# Patient Record
Sex: Female | Born: 1995 | Race: White | Hispanic: No | Smoking: Former smoker
Health system: Southern US, Community
[De-identification: ages and names within clinical notes are randomized; demographics above are authoritative.]

## PROBLEM LIST (undated history)

## (undated) ENCOUNTER — Inpatient Hospital Stay: Payer: Self-pay

## (undated) DIAGNOSIS — F319 Bipolar disorder, unspecified: Secondary | ICD-10-CM

## (undated) DIAGNOSIS — K439 Ventral hernia without obstruction or gangrene: Secondary | ICD-10-CM

## (undated) DIAGNOSIS — G43109 Migraine with aura, not intractable, without status migrainosus: Secondary | ICD-10-CM

## (undated) DIAGNOSIS — F4323 Adjustment disorder with mixed anxiety and depressed mood: Secondary | ICD-10-CM

## (undated) DIAGNOSIS — Z87891 Personal history of nicotine dependence: Secondary | ICD-10-CM

## (undated) DIAGNOSIS — K219 Gastro-esophageal reflux disease without esophagitis: Secondary | ICD-10-CM

## (undated) DIAGNOSIS — Z87898 Personal history of other specified conditions: Secondary | ICD-10-CM

## (undated) HISTORY — PX: OTHER SURGICAL HISTORY: SHX169

## (undated) HISTORY — DX: Personal history of other specified conditions: Z87.898

## (undated) HISTORY — DX: Adjustment disorder with mixed anxiety and depressed mood: F43.23

## (undated) HISTORY — PX: NO PAST SURGERIES: SHX2092

## (undated) HISTORY — DX: Migraine with aura, not intractable, without status migrainosus: G43.109

## (undated) HISTORY — DX: Personal history of nicotine dependence: Z87.891

## (undated) HISTORY — DX: Ventral hernia without obstruction or gangrene: K43.9

---
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
## 2016-01-02 ENCOUNTER — Emergency Department
Admission: EM | Admit: 2016-01-02 | Discharge: 2016-01-02 | Disposition: A | Discharge status: Home / Self Care | Payer: Self-pay | Attending: Emergency Medicine | Admitting: Emergency Medicine

## 2016-01-02 ENCOUNTER — Encounter: Payer: Self-pay | Admitting: Emergency Medicine

## 2016-01-02 DIAGNOSIS — F17210 Nicotine dependence, cigarettes, uncomplicated: Secondary | ICD-10-CM | POA: Insufficient documentation

## 2016-01-02 DIAGNOSIS — N390 Urinary tract infection, site not specified: Secondary | ICD-10-CM | POA: Insufficient documentation

## 2016-01-02 LAB — WET PREP, GENITAL
Clue Cells Wet Prep HPF POC: NONE SEEN
SPERM: NONE SEEN
Trich, Wet Prep: NONE SEEN
Yeast Wet Prep HPF POC: NONE SEEN

## 2016-01-02 LAB — URINALYSIS COMPLETE WITH MICROSCOPIC (ARMC ONLY)
Bacteria, UA: NONE SEEN
Bilirubin Urine: NEGATIVE
GLUCOSE, UA: NEGATIVE mg/dL
NITRITE: NEGATIVE
PH: 6 (ref 5.0–8.0)
Protein, ur: NEGATIVE mg/dL
Specific Gravity, Urine: 1.019 (ref 1.005–1.030)

## 2016-01-02 LAB — COMPREHENSIVE METABOLIC PANEL
ALT: 11 U/L — ABNORMAL LOW (ref 14–54)
AST: 29 U/L (ref 15–41)
Albumin: 4.8 g/dL (ref 3.5–5.0)
Alkaline Phosphatase: 72 U/L (ref 38–126)
Anion gap: 9 (ref 5–15)
BILIRUBIN TOTAL: 1.1 mg/dL (ref 0.3–1.2)
BUN: 11 mg/dL (ref 6–20)
CHLORIDE: 105 mmol/L (ref 101–111)
CO2: 24 mmol/L (ref 22–32)
CREATININE: 0.79 mg/dL (ref 0.44–1.00)
Calcium: 9.6 mg/dL (ref 8.9–10.3)
Glucose, Bld: 77 mg/dL (ref 65–99)
Potassium: 3.9 mmol/L (ref 3.5–5.1)
Sodium: 138 mmol/L (ref 135–145)
TOTAL PROTEIN: 8.4 g/dL — AB (ref 6.5–8.1)

## 2016-01-02 LAB — POCT PREGNANCY, URINE: PREG TEST UR: NEGATIVE

## 2016-01-02 LAB — CHLAMYDIA/NGC RT PCR (ARMC ONLY)
Chlamydia Tr: DETECTED — AB
N gonorrhoeae: NOT DETECTED

## 2016-01-02 LAB — CBC
HCT: 40 % (ref 35.0–47.0)
Hemoglobin: 14.1 g/dL (ref 12.0–16.0)
MCH: 29.8 pg (ref 26.0–34.0)
MCHC: 35.2 g/dL (ref 32.0–36.0)
MCV: 84.6 fL (ref 80.0–100.0)
PLATELETS: 272 10*3/uL (ref 150–440)
RBC: 4.72 MIL/uL (ref 3.80–5.20)
RDW: 13.3 % (ref 11.5–14.5)
WBC: 10.5 10*3/uL (ref 3.6–11.0)

## 2016-01-02 LAB — LIPASE, BLOOD: LIPASE: 13 U/L (ref 11–51)

## 2016-01-02 LAB — HCG, QUANTITATIVE, PREGNANCY: HCG, BETA CHAIN, QUANT, S: 1 m[IU]/mL (ref <5)

## 2016-01-02 MED ORDER — NITROFURANTOIN MONOHYD MACRO 100 MG PO CAPS
100.0000 mg | ORAL_CAPSULE | Freq: Once | ORAL | Status: AC
  Administered 2016-01-02: 100 mg via ORAL
  Filled 2016-01-02: qty 1

## 2016-01-02 MED ORDER — NORETHINDRONE-ETH ESTRADIOL 1-5 MG-MCG PO TABS: 1.0000 | ORAL_TABLET | Freq: Every day | ORAL | 2 refills | Status: DC

## 2016-01-02 MED ORDER — SODIUM CHLORIDE 0.9 % IV BOLUS (SEPSIS)
1000.0000 mL | Freq: Once | INTRAVENOUS | Status: AC
  Administered 2016-01-02: 1000 mL via INTRAVENOUS

## 2016-01-02 MED ORDER — ONDANSETRON HCL 4 MG/2ML IJ SOLN
4.0000 mg | Freq: Once | INTRAMUSCULAR | Status: AC
  Administered 2016-01-02: 4 mg via INTRAVENOUS
  Filled 2016-01-02: qty 2

## 2016-01-02 MED ORDER — NITROFURANTOIN MONOHYD MACRO 100 MG PO CAPS: 100.0000 mg | ORAL_CAPSULE | Freq: Two times a day (BID) | ORAL | 0 refills | Status: AC

## 2016-01-02 MED ORDER — KETOROLAC TROMETHAMINE 30 MG/ML IJ SOLN
30.0000 mg | Freq: Once | INTRAMUSCULAR | Status: AC
  Administered 2016-01-02: 30 mg via INTRAVENOUS
  Filled 2016-01-02: qty 1

## 2016-01-02 NOTE — ED Provider Notes (Signed)
[facility] Emergency Department Provider Note  ____________________________________________  Time seen: Approximately 6:02 PM  I have reviewed the triage vital signs and the nursing notes.   HISTORY  Chief Complaint Abdominal Pain and Back Pain   HPI Martha [name] is a 20 y.o. female with no significant past medical history who presents for evaluation of abdominal cramping. She reports 5 days of intermittent abdominal cramping localized to the lower part of her abdomen and radiating to her bilateral lower back. She also reports 3 days of vaginal bleeding. She reports that a week ago she took a pregnancy test which was positive but took one yesterday and that one was negative. She has had one prior pregnancy that ended up in miscarriage. She is sexually active with one partner and uses no protection. She denies vaginal discharge or prior history of STDs. She reports that her pain is 5/10, associated with nausea. She denies vomiting, dysuria, hematuria, diarrhea, constipation.  History reviewed. No pertinent past medical history.  There are no active problems to display for this patient.   History reviewed. No pertinent surgical history.  Prior to Admission medications   Medication Sig Start Date End Date Taking? Authorizing Provider  nitrofurantoin, macrocrystal-monohydrate, (MACROBID) 100 MG capsule Take 1 capsule (100 mg total) by mouth 2 (two) times daily. 01/02/16 01/07/16  Nita Sickle, MD  norethindrone-ethinyl estradiol (FEMHRT 1/5) 1-5 MG-MCG TABS tablet Take 1 tablet by mouth daily. 01/02/16   Nita Sickle, MD    Allergies Review of patient's allergies indicates no known allergies.  History reviewed. No pertinent family history.  Social History Social History  Substance Use Topics  . Smoking status: Current Every Day Smoker    Packs/day: 0.50    Years: 4.00    Types: Cigarettes  . Smokeless tobacco: Never Used  . Alcohol use No      Review of Systems  Constitutional: Negative for fever. Eyes: Negative for visual changes. ENT: Negative for sore throat. Cardiovascular: Negative for chest pain. Respiratory: Negative for shortness of breath. Gastrointestinal: + lower abdominal cramping and nausea. No vomiting or diarrhea. Genitourinary: Negative for dysuria. + vaginal bleeding Musculoskeletal: Negative for back pain. Skin: Negative for rash. Neurological: Negative for headaches, weakness or numbness.  ____________________________________________   PHYSICAL EXAM:  VITAL SIGNS: ED Triage Vitals  Enc Vitals Group     BP 01/02/16 1737 118/79     Pulse Rate 01/02/16 1737 68     Resp 01/02/16 1737 18     Temp 01/02/16 1737 98.8 F (37.1 C)     Temp Source 01/02/16 1737 Oral     SpO2 01/02/16 1737 100 %     Weight 01/02/16 1737 138 lb (62.6 kg)     Height 01/02/16 1737 5\' 4"  (1.626 m)     Head Circumference --      Peak Flow --      Pain Score 01/02/16 1738 4     Pain Loc --      Pain Edu? --      Excl. in GC? --     Constitutional: Alert and oriented. Well appearing and in no apparent distress. HEENT:      Head: Normocephalic and atraumatic.         Eyes: Conjunctivae are normal. Sclera is non-icteric. EOMI. PERRL      Mouth/Throat: Mucous membranes are moist.       Neck: Supple with no signs of meningismus. Cardiovascular: Regular rate and rhythm. No murmurs, gallops, or  rubs. 2+ symmetrical distal pulses are present in all extremities. No JVD. Respiratory: Normal respiratory effort. Lungs are clear to auscultation bilaterally. No wheezes, crackles, or rhonchi.  Gastrointestinal: Soft, ttp over the suprapubic region, and non distended with positive bowel sounds. No rebound or guarding. Genitourinary: No CVA tenderness.  Pelvic exam: Normal external genitalia, no rashes or lesions. Normal cervical mucus. Os closed. No cervical motion tenderness.  No uterine or adnexal tenderness.   Musculoskeletal:  Nontender with normal range of motion in all extremities. No edema, cyanosis, or erythema of extremities. Neurologic: Normal speech and language. Face is symmetric. Moving all extremities. No gross focal neurologic deficits are appreciated. Skin: Skin is warm, dry and intact. No rash noted. Psychiatric: Mood and affect are normal. Speech and behavior are normal.  ____________________________________________   LABS (all labs ordered are listed, but only abnormal results are displayed)  Labs Reviewed  WET PREP, GENITAL - Abnormal; Notable for the following:       Result Value   WBC, Wet Prep HPF POC MODERATE (*)    All other components within normal limits  COMPREHENSIVE METABOLIC PANEL - Abnormal; Notable for the following:    Total Protein 8.4 (*)    ALT 11 (*)    All other components within normal limits  URINALYSIS COMPLETEWITH MICROSCOPIC ([facility] ONLY) - Abnormal; Notable for the following:    Color, Urine YELLOW (*)    APPearance CLOUDY (*)    Ketones, ur 2+ (*)    Hgb urine dipstick 1+ (*)    Leukocytes, UA 2+ (*)    Squamous Epithelial / LPF 6-30 (*)    All other components within normal limits  URINE CULTURE  CHLAMYDIA/NGC RT PCR ([facility] ONLY)  LIPASE, BLOOD  CBC  HCG, QUANTITATIVE, PREGNANCY  POC URINE PREG, ED  POCT PREGNANCY, URINE   ____________________________________________  EKG  none ____________________________________________  RADIOLOGY  none  ____________________________________________   PROCEDURES  Procedure(s) performed: None Procedures Critical Care performed:  None ____________________________________________   INITIAL IMPRESSION / ASSESSMENT AND PLAN / ED COURSE  20 y.o. female with no significant past medical history who presents for evaluation of intermittent moderate lower abdominal cramping x 5 days associated with vaginal bleeding. On exam she is well appearing, in no distress, her vital signs are within normal limits, her  abdomen is soft and with mild tenderness to palpation in the suprapubic region, no rales lower quadrant tenderness, no rebound or guarding, no flank tenderness. Impression diagnosis including menses vs miscarriage vs UTI vs STD. Plan for pelvic exam, labs, wet prep, Gc/chlamydia, UA, Upreg. Will treat symptoms with IV Zofran, IV Toradol, and IV fluids.  Clinical Course  Comment By Time  Pelvic exam within normal limits with no CMT, normal cervical discharge, no adnexal tenderness. Serial abdominal exams and no tenderness. Nita Sickle, MD 07/30 1926  UA positive for UTI. Patient was started on Macrobid. Wet prep is negative. GC and chlamydia pending however with normal pelvic exam will hold off on treatment at this time. Serial abdominal exams reassuring with mild suprapubic tenderness but no right lower quadrant tenderness, no rebound or guarding. I spent 15 minutes talking to the patient about safe sex and counseling how to avoid pregnancy and STDs. We'll provide her with a prescription for birth control. Nita Sickle, MD 07/30 2004    Pertinent labs & imaging results that were available during my care of the patient were reviewed by me and considered in my medical decision making (see chart for details).  ____________________________________________   FINAL CLINICAL IMPRESSION(S) / ED DIAGNOSES  Final diagnoses:  UTI (lower urinary tract infection)      NEW MEDICATIONS STARTED DURING THIS VISIT:  New Prescriptions   NITROFURANTOIN, MACROCRYSTAL-MONOHYDRATE, (MACROBID) 100 MG CAPSULE    Take 1 capsule (100 mg total) by mouth 2 (two) times daily.   NORETHINDRONE-ETHINYL ESTRADIOL (FEMHRT 1/5) 1-5 MG-MCG TABS TABLET    Take 1 tablet by mouth daily.     Note:  This document was prepared using Dragon voice recognition software and may include unintentional dictation errors.    Nita Sickle, MD 01/02/16 2008

## 2016-01-02 NOTE — ED Triage Notes (Signed)
Pt presents to ED with c/o lower abdominal pain and lower back pain at this time. Pt states today she started having nausea and vomiting. NAD noted at this time.

## 2016-01-02 NOTE — Discharge Instructions (Signed)

## 2016-01-02 NOTE — ED Notes (Signed)
Patient resting, watching television, family members at bedside. Patient denies need for anything at this time. Pelvic exam completed by EDP in the presence of NT. Tolerated well.

## 2016-01-02 NOTE — ED Notes (Signed)
Pt states she has had some positive pregnancy tests and some negative.  C/o intermittent cramping x1 week. Pt reports she is on her period at this time.

## 2016-01-02 NOTE — ED Notes (Signed)
Attempted IV access x1 unsuccessful able to obtain blood specimens

## 2016-01-03 ENCOUNTER — Telehealth: Payer: Self-pay | Admitting: Emergency Medicine

## 2016-01-03 NOTE — Telephone Encounter (Signed)
Called patient to inform of std results.  Positive chlamydia and was not treated.  Per Dr.Quale--may call in azithromycin 1 gram po to patient preferred pharmacy when we contact her.  She did not answer phone and no voicemail set up.  Will send letter.

## 2016-01-05 LAB — URINE CULTURE

## 2016-02-03 ENCOUNTER — Encounter: Payer: Self-pay | Admitting: Medical Oncology

## 2016-02-03 ENCOUNTER — Emergency Department
Admission: EM | Admit: 2016-02-03 | Discharge: 2016-02-03 | Disposition: A | Discharge status: Home / Self Care | Payer: Self-pay | Attending: Emergency Medicine | Admitting: Emergency Medicine

## 2016-02-03 DIAGNOSIS — F17210 Nicotine dependence, cigarettes, uncomplicated: Secondary | ICD-10-CM | POA: Insufficient documentation

## 2016-02-03 DIAGNOSIS — Z79899 Other long term (current) drug therapy: Secondary | ICD-10-CM | POA: Insufficient documentation

## 2016-02-03 DIAGNOSIS — Z792 Long term (current) use of antibiotics: Secondary | ICD-10-CM | POA: Insufficient documentation

## 2016-02-03 DIAGNOSIS — H6091 Unspecified otitis externa, right ear: Secondary | ICD-10-CM | POA: Insufficient documentation

## 2016-02-03 DIAGNOSIS — H6691 Otitis media, unspecified, right ear: Secondary | ICD-10-CM | POA: Insufficient documentation

## 2016-02-03 MED ORDER — NEOMYCIN-POLYMYXIN-HC 3.5-10000-1 OP SUSP: 3.0000 [drp] | Freq: Four times a day (QID) | OPHTHALMIC | 0 refills | Status: AC

## 2016-02-03 MED ORDER — AMOXICILLIN 500 MG PO TABS: 500.0000 mg | ORAL_TABLET | Freq: Three times a day (TID) | ORAL | 0 refills | Status: AC

## 2016-02-03 NOTE — ED Notes (Signed)
Pt in via triage with complaints of right ear pain and "green puss like" drainage x "2-3 days."  Pt denies any other symptoms.  Pt A/Ox4, no immediate distress noted.

## 2016-02-03 NOTE — ED Provider Notes (Signed)
[facility] Emergency Department Provider Note  ____________________________________________  Time seen: Approximately 1:20 PM  I have reviewed the triage vital signs and the nursing notes.   HISTORY  Chief Complaint Otalgia    HPI Martha [name] is a 20 y.o. female , NAD, presents to emergency department with2 day history of right ear pain and drainage. Patient states that approximately one week ago she got water in the right ear while in the shower. States she was unable to get the water out. Has been placing cotton balls about the outside of the ear canal with no resolution of symptoms. Over the last 2 days is noted yellow drainage and increasing pain about the right ear canal and in her ear. Has noted no bloody discharge. Denies swimming or other emersion in water. Denies any injury or trauma to the ear or face. Has not had any fevers, chills, body aches. Denies neck pain. Has not noted any skin sores. Denies headaches, nasal congestion, runny nose, sinus pressure, sore throat. Has had no chest pain, shortness breath, abdominal pain, nausea, vomiting.   History reviewed. No pertinent past medical history.  There are no active problems to display for this patient.   History reviewed. No pertinent surgical history.  Prior to Admission medications   Medication Sig Start Date End Date Taking? Authorizing Provider  amoxicillin (AMOXIL) 500 MG tablet Take 1 tablet (500 mg total) by mouth 3 (three) times daily with meals. 02/03/16 02/10/16  Avontae Burkhead L Ashtynn Berke, PA-C  neomycin-polymyxin-hydrocortisone (CORTISPORIN) 3.5-10000-1 ophthalmic suspension Place 3 drops into the right eye 4 (four) times daily. Please use these drops in the RIGHT EAR. 02/03/16 02/10/16  Jaelie Aguilera L Finnley Lewis, PA-C  norethindrone-ethinyl estradiol (FEMHRT 1/5) 1-5 MG-MCG TABS tablet Take 1 tablet by mouth daily. 01/02/16   Nita Sicklearolina Veronese, MD    Allergies Review of patient's allergies indicates no known  allergies.  No family history on file.  Social History Social History  Substance Use Topics  . Smoking status: Current Every Day Smoker    Packs/day: 0.50    Years: 4.00    Types: Cigarettes  . Smokeless tobacco: Never Used  . Alcohol use No     Review of Systems  Constitutional: No fever/chills ENT: Positive right ear pain, right ear drainage, muffled hearing. No tinnitus, sore throat, nasal congestion, runny nose, sinus pressure.  Cardiovascular: No chest pain. Respiratory: No shortness of breath.  Gastrointestinal: No abdominal pain.  No nausea, vomiting.   Musculoskeletal: Negative for neck pain or general myalgias.  Skin: Negative for rash, redness, swelling, skin sores. Neurological: Negative for headaches. 10-point ROS otherwise negative.  ____________________________________________   PHYSICAL EXAM:  VITAL SIGNS: ED Triage Vitals  Enc Vitals Group     BP 02/03/16 1313 119/77     Pulse Rate 02/03/16 1313 81     Resp 02/03/16 1313 16     Temp 02/03/16 1313 98 F (36.7 C)     Temp src --      SpO2 02/03/16 1313 100 %     Weight 02/03/16 1314 135 lb (61.2 kg)     Height 02/03/16 1314 5\' 4"  (1.626 m)     Head Circumference --      Peak Flow --      Pain Score 02/03/16 1314 6     Pain Loc --      Pain Edu? --      Excl. in GC? --      Constitutional: Alert and oriented. Well  appearing and in no acute distress. Eyes: Conjunctivae are normal without icterus or injection Head: Atraumatic. ENT:      Ears: Right external ear canal with moderate swelling and yellow discharge. Tenderness with manipulation of the right tragus. No tenderness with manipulation of the right pinna. Right TM visualized with moderate erythema, trace effusion but no bulging or perforation.       Nose: No congestion/rhinnorhea. Neck: Supple with full range of motion. Hematological/Lymphatic/Immunilogical: Positive right anterior cervical lymphadenopathy with trace tenderness to palpation  but is mobile. Cardiovascular: Normal rate, regular rhythm. Normal S1 and S2.  Good peripheral circulation. Respiratory: Normal respiratory effort without tachypnea or retractions. Lungs CTAB with breath sounds noted in all lung fields. No wheeze, rhonchi, rales. Neurologic:  Normal speech and language. No gross focal neurologic deficits are appreciated.  Skin:  Skin is warm, dry and intact. No rash noted. Psychiatric: Mood and affect are normal. Speech and behavior are normal. Patient exhibits appropriate insight and judgement.   ____________________________________________   LABS  None ____________________________________________  EKG  None ____________________________________________  RADIOLOGY  None ____________________________________________    PROCEDURES  Procedure(s) performed: None   Procedures   Medications - No data to display   ____________________________________________   INITIAL IMPRESSION / ASSESSMENT AND PLAN / ED COURSE  Pertinent labs & imaging results that were available during my care of the patient were reviewed by me and considered in my medical decision making (see chart for details).  Clinical Course    Patient's diagnosis is consistent with Right otitis externa and otitis media. Patient will be discharged home with prescriptions for amoxicillin and Cortisporin drops to use as directed. I prescribed Cortisporin Ophthalmic drops to be used in the ears as the otic solution is significantly more expensive. The patient will be able to afford the $4 bottle of the Cortisporin Ophthalmic drops which is the same formulation as the otic drops. May take over-the-counter Tylenol or ibuprofen as needed for pain. Continue to protect the right ear from water while in the shower or bath. The 420 vitals, hot tubs or any other activity which the ears would be submerged. Patient is to follow up with [facility] if symptoms persist past this  treatment course. Patient is given ED precautions to return to the ED for any worsening or new symptoms.    ____________________________________________  FINAL CLINICAL IMPRESSION(S) / ED DIAGNOSES  Final diagnoses:  Otitis externa, right  Acute right otitis media, recurrence not specified, unspecified otitis media type      NEW MEDICATIONS STARTED DURING THIS VISIT:  Discharge Medication List as of 02/03/2016  1:32 PM    START taking these medications   Details  amoxicillin (AMOXIL) 500 MG tablet Take 1 tablet (500 mg total) by mouth 3 (three) times daily with meals., Starting Thu 02/03/2016, Until Thu 02/10/2016, Print    neomycin-polymyxin-hydrocortisone (CORTISPORIN) 3.5-10000-1 ophthalmic suspension Place 3 drops into the right eye 4 (four) times daily. Please use these drops in the RIGHT EAR., Starting Thu 02/03/2016, Until Thu 02/10/2016, Print             Ernestene Kiel [place], PA-C 02/03/16 1405    Emily Filbert, MD 02/03/16 [phone]

## 2016-02-03 NOTE — ED Triage Notes (Signed)
Pt reports rt ear pain x 2 days without fever

## 2016-05-16 ENCOUNTER — Encounter: Payer: Self-pay | Admitting: Emergency Medicine

## 2016-05-16 DIAGNOSIS — F17210 Nicotine dependence, cigarettes, uncomplicated: Secondary | ICD-10-CM | POA: Insufficient documentation

## 2016-05-16 DIAGNOSIS — N939 Abnormal uterine and vaginal bleeding, unspecified: Secondary | ICD-10-CM | POA: Insufficient documentation

## 2016-05-16 DIAGNOSIS — Z5321 Procedure and treatment not carried out due to patient leaving prior to being seen by health care provider: Secondary | ICD-10-CM | POA: Insufficient documentation

## 2016-05-16 LAB — BASIC METABOLIC PANEL
Anion gap: 5 (ref 5–15)
BUN: 9 mg/dL (ref 6–20)
CHLORIDE: 103 mmol/L (ref 101–111)
CO2: 30 mmol/L (ref 22–32)
Calcium: 9.5 mg/dL (ref 8.9–10.3)
Creatinine, Ser: 0.57 mg/dL (ref 0.44–1.00)
GFR calc Af Amer: >60 mL/min (ref >60)
GFR calc non Af Amer: >60 mL/min (ref >60)
GLUCOSE: 82 mg/dL (ref 65–99)
Potassium: 3.6 mmol/L (ref 3.5–5.1)
Sodium: 138 mmol/L (ref 135–145)

## 2016-05-16 LAB — POCT PREGNANCY, URINE: Preg Test, Ur: NEGATIVE

## 2016-05-16 NOTE — ED Triage Notes (Signed)
Pt ambulatory to triage with steady gait, no distress noted. Pt reports she started to have vaginal bleeding today, assumed it was her period starting, Pt sts "I went to bathroom and when I wiped, there was blood in my urine and not my vagina."

## 2016-05-16 NOTE — ED Notes (Signed)
Pt found sitting in lobby, st did not hear name called; triage nurse notified

## 2016-05-16 NOTE — ED Notes (Signed)
No answer when called from lobby 

## 2016-05-17 ENCOUNTER — Emergency Department
Admission: EM | Admit: 2016-05-17 | Discharge: 2016-05-17 | Discharge status: Against Medical Advice | Payer: Self-pay | Attending: Emergency Medicine | Admitting: Emergency Medicine

## 2016-05-17 LAB — CBC
HCT: 41.8 % (ref 35.0–47.0)
Hemoglobin: 14 g/dL (ref 12.0–16.0)
MCH: 28.5 pg (ref 26.0–34.0)
MCHC: 33.5 g/dL (ref 32.0–36.0)
MCV: 85.1 fL (ref 80.0–100.0)
Platelets: UNDETERMINED 10*3/uL (ref 150–440)
RBC: 4.91 MIL/uL (ref 3.80–5.20)
RDW: 13.3 % (ref 11.5–14.5)
WBC: 12.5 10*3/uL — ABNORMAL HIGH (ref 3.6–11.0)

## 2016-05-17 LAB — URINALYSIS, COMPLETE (UACMP) WITH MICROSCOPIC
BILIRUBIN URINE: NEGATIVE
GLUCOSE, UA: NEGATIVE mg/dL
Ketones, ur: NEGATIVE mg/dL
LEUKOCYTES UA: NEGATIVE
Nitrite: NEGATIVE
PH: 7 (ref 5.0–8.0)
Protein, ur: NEGATIVE mg/dL
Specific Gravity, Urine: 1.013 (ref 1.005–1.030)

## 2016-05-18 ENCOUNTER — Encounter: Payer: Self-pay | Admitting: Urgent Care

## 2016-05-18 DIAGNOSIS — R102 Pelvic and perineal pain: Secondary | ICD-10-CM | POA: Insufficient documentation

## 2016-05-18 DIAGNOSIS — F17210 Nicotine dependence, cigarettes, uncomplicated: Secondary | ICD-10-CM | POA: Insufficient documentation

## 2016-05-18 LAB — COMPREHENSIVE METABOLIC PANEL
ALK PHOS: 71 U/L (ref 38–126)
ALT: 17 U/L (ref 14–54)
ANION GAP: 8 (ref 5–15)
AST: 24 U/L (ref 15–41)
Albumin: 4.7 g/dL (ref 3.5–5.0)
BILIRUBIN TOTAL: 0.2 mg/dL — AB (ref 0.3–1.2)
BUN: 9 mg/dL (ref 6–20)
CALCIUM: 9.3 mg/dL (ref 8.9–10.3)
CO2: 24 mmol/L (ref 22–32)
Chloride: 105 mmol/L (ref 101–111)
Creatinine, Ser: 0.57 mg/dL (ref 0.44–1.00)
GFR calc non Af Amer: >60 mL/min (ref >60)
Glucose, Bld: 91 mg/dL (ref 65–99)
POTASSIUM: 3.6 mmol/L (ref 3.5–5.1)
SODIUM: 137 mmol/L (ref 135–145)
TOTAL PROTEIN: 8.7 g/dL — AB (ref 6.5–8.1)

## 2016-05-18 LAB — CBC
HCT: 41.5 % (ref 35.0–47.0)
HEMOGLOBIN: 14.4 g/dL (ref 12.0–16.0)
MCH: 29.2 pg (ref 26.0–34.0)
MCHC: 34.8 g/dL (ref 32.0–36.0)
MCV: 83.7 fL (ref 80.0–100.0)
Platelets: 271 10*3/uL (ref 150–440)
RBC: 4.95 MIL/uL (ref 3.80–5.20)
RDW: 12.8 % (ref 11.5–14.5)
WBC: 8.3 10*3/uL (ref 3.6–11.0)

## 2016-05-18 LAB — LIPASE, BLOOD: Lipase: 13 U/L (ref 11–51)

## 2016-05-18 NOTE — ED Triage Notes (Addendum)
Patient presents with c/o abdominal cramping that began earlier today. Patient reports that she has been shaking and "feverish". (+) N/V x 1 episode. Denies urinary symptoms. Patient admits to drinking ETOH tonight.

## 2016-05-19 ENCOUNTER — Emergency Department: Payer: Self-pay

## 2016-05-19 ENCOUNTER — Emergency Department
Admission: EM | Admit: 2016-05-19 | Discharge: 2016-05-19 | Disposition: A | Discharge status: Home / Self Care | Payer: Self-pay | Attending: Emergency Medicine | Admitting: Emergency Medicine

## 2016-05-19 DIAGNOSIS — R102 Pelvic and perineal pain: Secondary | ICD-10-CM

## 2016-05-19 LAB — URINALYSIS, COMPLETE (UACMP) WITH MICROSCOPIC
BILIRUBIN URINE: NEGATIVE
Bacteria, UA: NONE SEEN
GLUCOSE, UA: NEGATIVE mg/dL
KETONES UR: NEGATIVE mg/dL
LEUKOCYTES UA: NEGATIVE
NITRITE: NEGATIVE
PH: 6 (ref 5.0–8.0)
Protein, ur: NEGATIVE mg/dL
SPECIFIC GRAVITY, URINE: 1.005 (ref 1.005–1.030)

## 2016-05-19 LAB — ETHANOL: Alcohol, Ethyl (B): 27 mg/dL — ABNORMAL HIGH (ref <5)

## 2016-05-19 LAB — PREGNANCY, URINE: Preg Test, Ur: NEGATIVE

## 2016-05-19 MED ORDER — OXYCODONE-ACETAMINOPHEN 5-325 MG PO TABS: 1.0000 | ORAL_TABLET | ORAL | 0 refills | Status: DC | PRN

## 2016-05-19 MED ORDER — ONDANSETRON 4 MG PO TBDP: 4.0000 mg | ORAL_TABLET | Freq: Three times a day (TID) | ORAL | 0 refills | Status: DC | PRN

## 2016-05-19 MED ORDER — KETOROLAC TROMETHAMINE 30 MG/ML IJ SOLN
30.0000 mg | Freq: Once | INTRAMUSCULAR | Status: AC
  Administered 2016-05-19: 30 mg via INTRAVENOUS
  Filled 2016-05-19: qty 1

## 2016-05-19 NOTE — ED Notes (Signed)
Upon assessment pt reports pain around the time of her period that escalates when on her period. Pt denies any discomfort upon urination.

## 2016-05-19 NOTE — ED Provider Notes (Signed)
[facility] Emergency Department Provider Note   First MD Initiated Contact with Patient 05/19/16 0106     (approximate)  I have reviewed the triage vital signs and the nursing notes.   HISTORY  Chief Complaint Abdominal Cramping    HPI Martha [name] is a 20 y.o. female presents with pelvic pain described as cramping currently 4 out of 10 which the patient states occurs monthly during her menses. Patient's admits to nausea however denies any vomiting no diarrhea or constipation. Patient denies any urinary symptoms. Patient denies any vaginal discharge. The patient's chart revealed that she's had a visit to the emergency department in July of this year with similar symptoms with a negative evaluation performed.   Past medical history Chronic pelvic pain There are no active problems to display for this patient.   Past surgical history None  Prior to Admission medications   Medication Sig Start Date End Date Taking? Authorizing Provider  norethindrone-ethinyl estradiol (FEMHRT 1/5) 1-5 MG-MCG TABS tablet Take 1 tablet by mouth daily. 01/02/16   Nita Sickle, MD    Allergies Patient has no known allergies.  No family history on file.  Social History Social History  Substance Use Topics  . Smoking status: Current Every Day Smoker    Packs/day: 0.50    Years: 4.00    Types: Cigarettes  . Smokeless tobacco: Never Used  . Alcohol use Yes    Review of Systems Constitutional: No fever/chills Eyes: No visual changes. ENT: No sore throat. Cardiovascular: Denies chest pain. Respiratory: Denies shortness of breath. Gastrointestinal: No abdominal pain.  No nausea, no vomiting.  No diarrhea.  No constipation. Genitourinary: Negative for dysuria.Positive pelvic pain Musculoskeletal: Negative for back pain. Skin: Negative for rash. Neurological: Negative for headaches, focal weakness or numbness.  10-point ROS otherwise  negative.  ____________________________________________   PHYSICAL EXAM:  VITAL SIGNS: ED Triage Vitals [05/18/16 2320]  Enc Vitals Group     BP 127/85     Pulse Rate 98     Resp 16     Temp 97.7 F (36.5 C)     Temp Source Oral     SpO2 98 %     Weight 135 lb (61.2 kg)     Height 5\' 3"  (1.6 m)     Head Circumference      Peak Flow      Pain Score 10     Pain Loc      Pain Edu?      Excl. in GC?     Constitutional: Alert and oriented. Well appearing and in no acute distress. Eyes: Conjunctivae are normal. PERRL. EOMI. Head: Atraumatic. Mouth/Throat: Mucous membranes are moist.  Oropharynx non-erythematous. Neck: No stridor. Cardiovascular: Normal rate, regular rhythm. Good peripheral circulation. Grossly normal heart sounds. Respiratory: Normal respiratory effort.  No retractions. Lungs CTAB. Gastrointestinal: Soft and nontender. No distention.  Genitourinary: Unremarkable external genitalia, cervix appeared normal with no discharge noted. No cervical motion tenderness  Musculoskeletal: No lower extremity tenderness nor edema. No gross deformities of extremities. Neurologic:  Normal speech and language. No gross focal neurologic deficits are appreciated.  Skin:  Skin is warm, dry and intact. No rash noted. Psychiatric: Mood and affect are normal. Speech and behavior are normal.  ____________________________________________   LABS (all labs ordered are listed, but only abnormal results are displayed)  Labs Reviewed  COMPREHENSIVE METABOLIC PANEL - Abnormal; Notable for the following:       Result Value   Total  Protein 8.7 (*)    Total Bilirubin 0.2 (*)    All other components within normal limits  URINALYSIS, COMPLETE (UACMP) WITH MICROSCOPIC - Abnormal; Notable for the following:    Color, Urine STRAW (*)    APPearance CLEAR (*)    Hgb urine dipstick SMALL (*)    Squamous Epithelial / LPF 0-5 (*)    All other components within normal limits  ETHANOL -  Abnormal; Notable for the following:    Alcohol, Ethyl (B) 27 (*)    All other components within normal limits  LIPASE, BLOOD  CBC  PREGNANCY, URINE  POC URINE PREG, ED     RADIOLOGY I, [place] N BROWN, personally viewed and evaluated these images (plain radiographs) as part of my medical decision making, as well as reviewing the written report by the radiologist.  [place] Transvaginal Non-ob  Result Date: 05/19/2016 CLINICAL DATA:  20 y/o  F; diffuse pelvic pain for 1 week EXAM: TRANSABDOMINAL AND TRANSVAGINAL ULTRASOUND OF PELVIS TECHNIQUE: Both transabdominal and transvaginal ultrasound examinations of the pelvis were performed. Transabdominal technique was performed for global imaging of the pelvis including uterus, ovaries, adnexal regions, and pelvic cul-de-sac. It was necessary to proceed with endovaginal exam following the transabdominal exam to visualize the endometrium and ovaries. COMPARISON:  None FINDINGS: Uterus Measurements: 6.2 x 2.9 x 5.1 cm. No fibroids or other mass visualized. Endometrium Thickness: 4 mm.  No focal abnormality visualized. Right ovary Measurements: 2.6 x 2.9 x 2.9 cm. Normal appearance/no adnexal mass. Left ovary Measurements: 3.5 x 2.3 x 2.7 cm. Normal appearance/no adnexal mass. Other findings Moderate volume of simple fluid in the pelvis. IMPRESSION: No acute process of the uterus or ovaries. No adnexal mass. Moderate volume of simple fluid in the pelvis, borderline for what would be expected for physiologic fluid. Electronically Signed   By: Mitzi HansenLance  Furusawa-Stratton M.D.   On: 05/19/2016 02:59   [place] Pelvis Complete  Result Date: 05/19/2016 CLINICAL DATA:  20 y/o  F; diffuse pelvic pain for 1 week EXAM: TRANSABDOMINAL AND TRANSVAGINAL ULTRASOUND OF PELVIS TECHNIQUE: Both transabdominal and transvaginal ultrasound examinations of the pelvis were performed. Transabdominal technique was performed for global imaging of the pelvis including uterus, ovaries, adnexal  regions, and pelvic cul-de-sac. It was necessary to proceed with endovaginal exam following the transabdominal exam to visualize the endometrium and ovaries. COMPARISON:  None FINDINGS: Uterus Measurements: 6.2 x 2.9 x 5.1 cm. No fibroids or other mass visualized. Endometrium Thickness: 4 mm.  No focal abnormality visualized. Right ovary Measurements: 2.6 x 2.9 x 2.9 cm. Normal appearance/no adnexal mass. Left ovary Measurements: 3.5 x 2.3 x 2.7 cm. Normal appearance/no adnexal mass. Other findings Moderate volume of simple fluid in the pelvis. IMPRESSION: No acute process of the uterus or ovaries. No adnexal mass. Moderate volume of simple fluid in the pelvis, borderline for what would be expected for physiologic fluid. Electronically Signed   By: Mitzi HansenLance  Furusawa-Stratton M.D.   On: 05/19/2016 02:59     Procedures     INITIAL IMPRESSION / ASSESSMENT AND PLAN / ED COURSE  Pertinent labs & imaging results that were available during my care of the patient were reviewed by me and considered in my medical decision making (see chart for details).  Of note the patient's mother admits to a history of endometriosis. No clear etiology for the patient's pelvic pain obtained in the emergency department. Laboratory data ultrasound unremarkable. Patient has no pain at this time. We'll refer the patient to gynecology for  further outpatient evaluation and management.   Clinical Course     ____________________________________________  FINAL CLINICAL IMPRESSION(S) / ED DIAGNOSES  Final diagnoses:  Pelvic pain     MEDICATIONS GIVEN DURING THIS VISIT:  Medications  ketorolac (TORADOL) 30 MG/ML injection 30 mg (30 mg Intravenous Given 05/19/16 0151)     NEW OUTPATIENT MEDICATIONS STARTED DURING THIS VISIT:  New Prescriptions   No medications on file    Modified Medications   No medications on file    Discontinued Medications   No medications on file     Note:  This document was  prepared using Dragon voice recognition software and may include unintentional dictation errors.    Darci Currentandolph N Brown, MD 05/19/16 [phone]

## 2016-05-19 NOTE — ED Notes (Signed)
MD at bedside. 

## 2016-05-19 NOTE — ED Notes (Signed)
Pt ambulated to toilet in room without difficulty.

## 2017-06-05 NOTE — L&D Delivery Note (Signed)
Delivery Note At 4:56 AM a viable female was delivered via Vaginal, Spontaneous (Presentation: OA).  APGAR: 8, 9; weight  pending.   Placenta status:spontaneous, intact.  Cord: 3VC without complications.  Cord pH: N/A  Anesthesia: Epidural   Episiotomy:  none Lacerations:  none Suture Repair: N/A Est. Blood Loss (mL): 300mL   Mom to postpartum.  Baby to Couplet care / Skin to Skin.  Vena Austriandreas Moana Munford 05/19/2018, 5:08 AM

## 2017-07-31 ENCOUNTER — Encounter: Payer: Self-pay | Admitting: Emergency Medicine

## 2017-07-31 ENCOUNTER — Emergency Department: Payer: Self-pay

## 2017-07-31 ENCOUNTER — Emergency Department
Admission: EM | Admit: 2017-07-31 | Discharge: 2017-07-31 | Disposition: A | Discharge status: Home / Self Care | Payer: Self-pay | Attending: Emergency Medicine | Admitting: Emergency Medicine

## 2017-07-31 ENCOUNTER — Other Ambulatory Visit: Payer: Self-pay

## 2017-07-31 DIAGNOSIS — Z87891 Personal history of nicotine dependence: Secondary | ICD-10-CM | POA: Insufficient documentation

## 2017-07-31 DIAGNOSIS — Z79899 Other long term (current) drug therapy: Secondary | ICD-10-CM | POA: Insufficient documentation

## 2017-07-31 DIAGNOSIS — R109 Unspecified abdominal pain: Secondary | ICD-10-CM

## 2017-07-31 DIAGNOSIS — N760 Acute vaginitis: Secondary | ICD-10-CM

## 2017-07-31 DIAGNOSIS — O23591 Infection of other part of genital tract in pregnancy, first trimester: Secondary | ICD-10-CM | POA: Insufficient documentation

## 2017-07-31 DIAGNOSIS — Z3201 Encounter for pregnancy test, result positive: Secondary | ICD-10-CM | POA: Insufficient documentation

## 2017-07-31 DIAGNOSIS — Z3A01 Less than 8 weeks gestation of pregnancy: Secondary | ICD-10-CM | POA: Insufficient documentation

## 2017-07-31 DIAGNOSIS — B9689 Other specified bacterial agents as the cause of diseases classified elsewhere: Secondary | ICD-10-CM | POA: Insufficient documentation

## 2017-07-31 LAB — URINALYSIS, COMPLETE (UACMP) WITH MICROSCOPIC
Bacteria, UA: NONE SEEN
Bilirubin Urine: NEGATIVE
GLUCOSE, UA: NEGATIVE mg/dL
Ketones, ur: NEGATIVE mg/dL
Leukocytes, UA: NEGATIVE
NITRITE: NEGATIVE
PROTEIN: NEGATIVE mg/dL
SPECIFIC GRAVITY, URINE: 1.03 (ref 1.005–1.030)
pH: 5 (ref 5.0–8.0)

## 2017-07-31 LAB — WET PREP, GENITAL
SPERM: NONE SEEN
Trich, Wet Prep: NONE SEEN
YEAST WET PREP: NONE SEEN

## 2017-07-31 LAB — BASIC METABOLIC PANEL
Anion gap: 9 (ref 5–15)
BUN: 14 mg/dL (ref 6–20)
CALCIUM: 9.5 mg/dL (ref 8.9–10.3)
CO2: 25 mmol/L (ref 22–32)
CREATININE: 0.63 mg/dL (ref 0.44–1.00)
Chloride: 103 mmol/L (ref 101–111)
Glucose, Bld: 90 mg/dL (ref 65–99)
Potassium: 3.9 mmol/L (ref 3.5–5.1)
SODIUM: 137 mmol/L (ref 135–145)

## 2017-07-31 LAB — CBC
HCT: 39.3 % (ref 35.0–47.0)
Hemoglobin: 13.4 g/dL (ref 12.0–16.0)
MCH: 29.2 pg (ref 26.0–34.0)
MCHC: 34 g/dL (ref 32.0–36.0)
MCV: 85.9 fL (ref 80.0–100.0)
PLATELETS: 290 10*3/uL (ref 150–440)
RBC: 4.58 MIL/uL (ref 3.80–5.20)
RDW: 12.6 % (ref 11.5–14.5)
WBC: 9.8 10*3/uL (ref 3.6–11.0)

## 2017-07-31 LAB — CHLAMYDIA/NGC RT PCR (ARMC ONLY)
Chlamydia Tr: NOT DETECTED
N gonorrhoeae: NOT DETECTED

## 2017-07-31 LAB — ABO/RH: ABO/RH(D): O POS

## 2017-07-31 LAB — POCT PREGNANCY, URINE: Preg Test, Ur: POSITIVE — AB

## 2017-07-31 LAB — HCG, QUANTITATIVE, PREGNANCY: HCG, BETA CHAIN, QUANT, S: 340 m[IU]/mL — AB (ref <5)

## 2017-07-31 MED ORDER — METRONIDAZOLE 500 MG PO TABS: 500.0000 mg | ORAL_TABLET | Freq: Two times a day (BID) | ORAL | 0 refills | Status: AC

## 2017-07-31 MED ORDER — METRONIDAZOLE 500 MG PO TABS
ORAL_TABLET | ORAL | Status: AC
  Filled 2017-07-31: qty 1

## 2017-07-31 MED ORDER — METRONIDAZOLE 500 MG PO TABS
500.0000 mg | ORAL_TABLET | Freq: Once | ORAL | Status: AC
  Administered 2017-07-31: 500 mg via ORAL

## 2017-07-31 NOTE — ED Triage Notes (Signed)
Says [redacted] week pregnant with abd cramping for 2 days.  Yesterday started having whitish discharge and now it looks like blood.

## 2017-07-31 NOTE — ED Provider Notes (Signed)
[facility] Emergency Department Provider Note ____________________________________________   First MD Initiated Contact with Patient 07/31/17 1714     (approximate)  I have reviewed the triage vital signs and the nursing notes.   HISTORY  Chief Complaint Vaginal Discharge; Vaginal Bleeding; and Abdominal Cramping    HPI Martha [name] is a 22 y.o. female with no significant past medical history, who is G1P0 at unclear gestational age but likely around 5 weeks.  She presents with vaginal discharge for the last 2 days, initially clear/whitish, and now more pink with some blood.  She reports associated abdominal cramping, but no constant abdominal pain.  No weakness or lightheadedness, fever chills, urinary symptoms, or back pain.   History reviewed. No pertinent past medical history.  There are no active problems to display for this patient.   History reviewed. No pertinent surgical history.  Prior to Admission medications   Medication Sig Start Date End Date Taking? Authorizing Provider  metroNIDAZOLE (FLAGYL) 500 MG tablet Take 1 tablet (500 mg total) by mouth 2 (two) times daily for 7 days. 08/01/17 08/08/17  Dionne BucySiadecki, Jeanann Balinski, MD  norethindrone-ethinyl estradiol (FEMHRT 1/5) 1-5 MG-MCG TABS tablet Take 1 tablet by mouth daily. 01/02/16   Nita SickleVeronese, [place], MD  ondansetron (ZOFRAN ODT) 4 MG disintegrating tablet Take 1 tablet (4 mg total) by mouth every 8 (eight) hours as needed for nausea or vomiting. 05/19/16   Darci CurrentBrown, Eleva N, MD  oxyCODONE-acetaminophen (ROXICET) 5-325 MG tablet Take 1 tablet by mouth every 4 (four) hours as needed for severe pain. 05/19/16   Darci CurrentBrown, [place] N, MD    Allergies Patient has no known allergies.  No family history on file.  Social History Social History   Tobacco Use  . Smoking status: Former Smoker    Packs/day: 0.50    Years: 4.00    Pack years: 2.00    Types: Cigarettes  . Smokeless tobacco: Never  Used  Substance Use Topics  . Alcohol use: Yes  . Drug use: No    Review of Systems  Constitutional: No fever/chills. Eyes: No redness. ENT: No sore throat. Cardiovascular: Denies chest pain. Respiratory: Denies shortness of breath. Gastrointestinal: Positive for nausea and abdominal cramping.  Genitourinary: Negative for dysuria.  Positive for vaginal discharge. Musculoskeletal: Negative for back pain. Skin: Negative for rash. Neurological: Negative for headache.   ____________________________________________   PHYSICAL EXAM:  VITAL SIGNS: ED Triage Vitals [07/31/17 1505]  Enc Vitals Group     BP 121/82     Pulse Rate 88     Resp 14     Temp 99.1 F (37.3 C)     Temp Source Oral     SpO2 98 %     Weight 160 lb (72.6 kg)     Height 5\' 4"  (1.626 m)     Head Circumference      Peak Flow      Pain Score 4     Pain Loc      Pain Edu?      Excl. in GC?     Constitutional: Alert and oriented. Well appearing and in no acute distress. Eyes: Conjunctivae are normal.  Head: Atraumatic. Nose: No congestion/rhinnorhea. Mouth/Throat: Mucous membranes are moist.   Neck: Normal range of motion.  Cardiovascular:  Good peripheral circulation. Respiratory: Normal respiratory effort.  No retractions. Gastrointestinal: Soft and nontender. No distention.  Genitourinary: Normal external genitalia.  No CMT or adnexal tenderness.  Small amount of clear/pink discharge.  No active  bleeding. Musculoskeletal:  Extremities warm and well perfused.  Neurologic:  Normal speech and language. No gross focal neurologic deficits are appreciated.  Skin:  Skin is warm and dry. No rash noted. Psychiatric: Mood and affect are normal. Speech and behavior are normal.  ____________________________________________   LABS (all labs ordered are listed, but only abnormal results are displayed)  Labs Reviewed  WET PREP, GENITAL - Abnormal; Notable for the following components:      Result Value    Clue Cells Wet Prep HPF POC PRESENT (*)    WBC, Wet Prep HPF POC MANY (*)    All other components within normal limits  HCG, QUANTITATIVE, PREGNANCY - Abnormal; Notable for the following components:   hCG, Beta Chain, Quant, S 340 (*)    All other components within normal limits  URINALYSIS, COMPLETE (UACMP) WITH MICROSCOPIC - Abnormal; Notable for the following components:   Color, Urine YELLOW (*)    APPearance CLEAR (*)    Hgb urine dipstick SMALL (*)    Squamous Epithelial / LPF 6-30 (*)    All other components within normal limits  POCT PREGNANCY, URINE - Abnormal; Notable for the following components:   Preg Test, Ur POSITIVE (*)    All other components within normal limits  CHLAMYDIA/NGC RT PCR ([facility] ONLY)  CBC  BASIC METABOLIC PANEL  POC URINE PREG, ED  ABO/RH   ____________________________________________  EKG   ____________________________________________  RADIOLOGY  US transvaginal: Gestational sac with no fetal pole or yolk sac  ____________________________________________   PROCEDURES  Procedure(s) performed: No  Procedures  Critical Care performed: No ____________________________________________   INITIAL IMPRESSION / ASSESSMENT AND PLAN / ED COURSE  Pertinent labs & imaging results that were available during my care of the patient were reviewed by me and considered in my medical decision making (see chart for details).  22 year old female G1 P0 with unclear LMP presents with vaginal discharge and abdominal cramping.  Past medical records reviewed in epic and are noncontributory.  On exam, the patient is well-appearing, vital signs are normal, and the pelvic exam is as described above with some discharge but no CMT or adnexal tenderness.  Pelvic ultrasound reveals gestational sac, but no yolk sac or fetal pole to signify definitive IUP.  Patient's hCG is 300.  At this time, differential for the abdominal cramping and discharge includes  threatened versus spontaneous miscarriage, BV, trichomonas, or other infectious cause, or less likely ectopic.  Overall the patient is stable at this time, and there is no clinical evidence for ruptured ectopic.  Plan: Cervical swab and wet prep, and discuss disposition with OB/GYN.  Anticipate discharge home.    ----------------------------------------- 7:11 PM on 07/31/2017 -----------------------------------------  Wet prep shows clue cells consistent with BV.  I consulted Dr. Feliberto Gottron from OB/GYN who agrees with discharge and follow-up in 2 days.  Referral provided.  I gave thorough return precautions both for threatened miscarriage as well as ectopic pregnancy, and the patient expressed understanding.  ____________________________________________   FINAL CLINICAL IMPRESSION(S) / ED DIAGNOSES  Final diagnoses:  BV (bacterial vaginosis)  Abdominal cramping      NEW MEDICATIONS STARTED DURING THIS VISIT:  New Prescriptions   METRONIDAZOLE (FLAGYL) 500 MG TABLET    Take 1 tablet (500 mg total) by mouth 2 (two) times daily for 7 days.     Note:  This document was prepared using Dragon voice recognition software and may include unintentional dictation errors.    Dionne Bucy, MD 07/31/17 [id number]

## 2017-07-31 NOTE — Discharge Instructions (Signed)
[facility], or at the health department in 2 days to have your hormone level rechecked.  The hCG hormone level today is 340.  Return to the emergency department for new, worsening, or persistent pain, bleeding, discharge, fevers, weakness, or any other new or worsening symptoms that concern you.

## 2017-08-01 ENCOUNTER — Ambulatory Visit (INDEPENDENT_AMBULATORY_CARE_PROVIDER_SITE_OTHER): Payer: Self-pay | Admitting: Obstetrics & Gynecology

## 2017-08-01 ENCOUNTER — Encounter: Payer: Self-pay | Admitting: Obstetrics & Gynecology

## 2017-08-01 VITALS — BP 110/70 | HR 78 | Ht 64.0 in | Wt 157.0 lb

## 2017-08-01 DIAGNOSIS — B9689 Other specified bacterial agents as the cause of diseases classified elsewhere: Secondary | ICD-10-CM

## 2017-08-01 DIAGNOSIS — O200 Threatened abortion: Secondary | ICD-10-CM | POA: Insufficient documentation

## 2017-08-01 DIAGNOSIS — N760 Acute vaginitis: Secondary | ICD-10-CM

## 2017-08-01 NOTE — Patient Instructions (Signed)

## 2017-08-01 NOTE — Progress Notes (Signed)
Obstetric Problem Visit    Chief Complaint  Patient presents with  . Threatened Miscarriage   History of Present Illness: Patient is a 22 y.o. G1P0 [redacted]w[redacted]d presenting for first trimester cramping and discharge.  The onset was yesterday.  Some pink with the d/c. No itch or burn or odor. Seen in ER and dx w BV. LMP 05/21/17, thus 10 2/7 weeks    [place] yesterday revealed gest sac 5 weeks size, no fetal pole Is bleeding equal to or greater than normal menstrual flow:  No Any recent trauma:  No Recent intercourse:  No History of prior miscarriage:  No Prior Serum HCG:  Yes 340 on 07/31/17 Rh status: unk  PMHx: She  has no past medical history on file. Also,  has no past surgical history on file., family history includes COPD in her maternal grandmother; Diabetes in her maternal grandfather; Hypertension in her maternal grandfather.,  reports that she has quit smoking. Her smoking use included cigarettes. She has a 2.00 pack-year smoking history. she has never used smokeless tobacco. She reports that she drinks alcohol. She reports that she does not use drugs.  She has a current medication list which includes the following prescription(s): metronidazole and ondansetron. Also, has No Known Allergies.  Review of Systems  Constitutional: Positive for malaise/fatigue. Negative for chills and fever.  HENT: Positive for congestion. Negative for sinus pain and sore throat.   Eyes: Negative for blurred vision and pain.  Respiratory: Positive for shortness of breath. Negative for cough and wheezing.   Cardiovascular: Negative for chest pain and leg swelling.  Gastrointestinal: Positive for abdominal pain, nausea and vomiting. Negative for constipation, diarrhea and heartburn.  Genitourinary: Negative for dysuria, frequency, hematuria and urgency.  Musculoskeletal: Negative for back pain, joint pain, myalgias and neck pain.  Skin: Negative for itching and rash.  Neurological: Positive for headaches.  Negative for dizziness, tremors and weakness.  Endo/Heme/Allergies: Does not bruise/bleed easily.  Psychiatric/Behavioral: Positive for depression. The patient is nervous/anxious. The patient does not have insomnia.    Objective: Vitals:   08/01/17 1401  BP: 110/70  Pulse: 78   Physical Exam  Constitutional: She is oriented to person, place, and time. She appears well-developed and well-nourished. No distress.  Genitourinary: Rectum normal, vagina normal and uterus normal. Pelvic exam was performed with patient supine. There is no rash or lesion on the right labia. There is no rash or lesion on the left labia. Vagina exhibits no lesion. No bleeding in the vagina. Right adnexum does not display mass and does not display tenderness. Left adnexum does not display mass and does not display tenderness. Cervix does not exhibit motion tenderness, lesion, friability or polyp.   Uterus is mobile and midaxial. Uterus is not enlarged or exhibiting a mass.  HENT:  Head: Normocephalic and atraumatic. Head is without laceration.  Right Ear: Hearing normal.  Left Ear: Hearing normal.  Nose: No epistaxis.  No foreign bodies.  Mouth/Throat: Uvula is midline, oropharynx is clear and moist and mucous membranes are normal.  Eyes: Pupils are equal, round, and reactive to light.  Neck: Normal range of motion. Neck supple. No thyromegaly present.  Cardiovascular: Normal rate and regular rhythm. Exam reveals no gallop and no friction rub.  No murmur heard. Pulmonary/Chest: Effort normal and breath sounds normal. No respiratory distress. She has no wheezes. Right breast exhibits no mass, no skin change and no tenderness. Left breast exhibits no mass, no skin change and no tenderness.  Abdominal: Soft. Bowel  sounds are normal. She exhibits no distension. There is no tenderness. There is no rebound.  Musculoskeletal: Normal range of motion.  Neurological: She is alert and oriented to person, place, and time. No  cranial nerve deficit.  Skin: Skin is warm and dry.  Psychiatric: She has a normal mood and affect. Judgment normal.  Vitals reviewed.  Results for orders placed or performed during the hospital encounter of 07/31/17  Wet prep, genital  Result Value Ref Range   Yeast Wet Prep HPF POC NONE SEEN NONE SEEN   Trich, Wet Prep NONE SEEN NONE SEEN   Clue Cells Wet Prep HPF POC PRESENT (A) NONE SEEN   WBC, Wet Prep HPF POC MANY (A) NONE SEEN   Sperm NONE SEEN   Chlamydia/NGC rt PCR ([facility] only)  Result Value Ref Range   Specimen source GC/Chlam ENDOCERVICAL    Chlamydia Tr NOT DETECTED NOT DETECTED   N gonorrhoeae NOT DETECTED NOT DETECTED  hCG, quantitative, pregnancy  Result Value Ref Range   hCG, Beta Chain, Quant, S 340 (H) <5 mIU/mL  CBC  Result Value Ref Range   WBC 9.8 3.6 - 11.0 K/uL   RBC 4.58 3.80 - 5.20 MIL/uL   Hemoglobin 13.4 12.0 - 16.0 g/dL   HCT 16.139.3 09.635.0 - 04.547.0 %   MCV 85.9 80.0 - 100.0 fL   MCH 29.2 26.0 - 34.0 pg   MCHC 34.0 32.0 - 36.0 g/dL   RDW 40.912.6 81.111.5 - 91.414.5 %   Platelets 290 150 - 440 K/uL  Basic metabolic panel  Result Value Ref Range   Sodium 137 135 - 145 mmol/L   Potassium 3.9 3.5 - 5.1 mmol/L   Chloride 103 101 - 111 mmol/L   CO2 25 22 - 32 mmol/L   Glucose, Bld 90 65 - 99 mg/dL   BUN 14 6 - 20 mg/dL   Creatinine, Ser 7.820.63 0.44 - 1.00 mg/dL   Calcium 9.5 8.9 - 95.610.3 mg/dL   GFR calc non Af Amer >60 >60 mL/min   GFR calc Af Amer >60 >60 mL/min   Anion gap 9 5 - 15  Urinalysis, Complete w Microscopic  Result Value Ref Range   Color, Urine YELLOW (A) YELLOW   APPearance CLEAR (A) CLEAR   Specific Gravity, Urine 1.030 1.005 - 1.030   pH 5.0 5.0 - 8.0   Glucose, UA NEGATIVE NEGATIVE mg/dL   Hgb urine dipstick SMALL (A) NEGATIVE   Bilirubin Urine NEGATIVE NEGATIVE   Ketones, ur NEGATIVE NEGATIVE mg/dL   Protein, ur NEGATIVE NEGATIVE mg/dL   Nitrite NEGATIVE NEGATIVE   Leukocytes, UA NEGATIVE NEGATIVE   RBC / HPF 0-5 0 - 5 RBC/hpf   WBC, UA 0-5  0 - 5 WBC/hpf   Bacteria, UA NONE SEEN NONE SEEN   Squamous Epithelial / LPF 6-30 (A) NONE SEEN   Mucus PRESENT   Pregnancy, urine POC  Result Value Ref Range   Preg Test, Ur POSITIVE (A) NEGATIVE  ABO/Rh  Result Value Ref Range   ABO/RH(D)      O POS Performed at [facility], [address] [place], [place] [address]    [place] Ob Comp Less 14 Wks  Result Date: 07/31/2017 CLINICAL DATA:  Vaginal bleeding and first-trimester pregnancy EXAM: OBSTETRIC <14 WK ULTRASOUND TECHNIQUE: Transabdominal ultrasound was performed for evaluation of the gestation as well as the maternal uterus and adnexal regions. COMPARISON:  05/19/2016 pelvic ultrasound FINDINGS: Intrauterine gestational sac: Single sac likely present Yolk sac:  Not Visualized. Embryo:  Not Visualized. MSD: 3.2 mm   5 w   0 d Subchorionic hemorrhage:  None visualized. Maternal uterus/adnexae: Corpus luteum is likely on the right. No adnexal mass. No pelvic fluid. IMPRESSION: Probable early intrauterine gestational sac (5 weeks) but no yolk sac or fetal pole. Recommend follow-up quantitative B-HCG levels and follow-up US in 14 days to assess viability. This recommendation follows SRU consensus guidelines: Diagnostic Criteria for Nonviable Pregnancy Early in the First Trimester. Malva Limes Med 2013; 782:9562-13. Electronically Signed   By: Marnee Spring M.D.   On: 07/31/2017 16:36   US Ob Transvaginal  Result Date: 07/31/2017 CLINICAL DATA:  Vaginal bleeding and first-trimester pregnancy EXAM: OBSTETRIC <14 WK ULTRASOUND TECHNIQUE: Transabdominal ultrasound was performed for evaluation of the gestation as well as the maternal uterus and adnexal regions. COMPARISON:  05/19/2016 pelvic ultrasound FINDINGS: Intrauterine gestational sac: Single sac likely present Yolk sac:  Not Visualized. Embryo:  Not Visualized. MSD: 3.2 mm   5 w   0 d Subchorionic hemorrhage:  None visualized. Maternal uterus/adnexae: Corpus luteum is likely on the  right. No adnexal mass. No pelvic fluid. IMPRESSION: Probable early intrauterine gestational sac (5 weeks) but no yolk sac or fetal pole. Recommend follow-up quantitative B-HCG levels and follow-up US in 14 days to assess viability. This recommendation follows SRU consensus guidelines: Diagnostic Criteria for Nonviable Pregnancy Early in the First Trimester. Malva Limes Med 2013; 086:5784-69. Electronically Signed   By: Marnee Spring M.D.   On: 07/31/2017 16:36   Assessment: 22 y.o. G1P0 w Threatened Abortion due to bleeding Plan: Problem List Items Addressed This Visit      Other   Threatened abortion - Primary    Other Visit Diagnoses    BV (bacterial vaginosis)        Beta hCG Thurs (48 hours)  1) First trimester bleeding - incidence and clinical course of first trimester bleeding is discussed in detail with the patient today.  Approximately 1/3 of pregnancies ending in live births experienced 1st trimester bleeding.  The amount of bleeding is variable and not necessarily predictive of outcome.  Sources may be cervical or uterine.  Subchorionic hemorrhages are a frequent concurrent findings on ultrasound and are followed expectantly.  These often absorb or regress spontaneously although risk for expansion and further disruption of the utero-placental interface leading to miscarriage is possible.  There is no clearly documented benefit to limiting or modifying activity and sexual intercourse in altering clinic course of 1st trimester bleeding.    2) If not already done will proceed with TVUS evaluation to document viability, and if uncertain viability or absence of a demonstrable IUP (and no previous documentation of IUP) will trend HCG levels.  3) The patient is Rh +, rhogam is therefore not indicated to decrease the risk rhesus alloimmunization.   4) Routine bleeding precautions were discussed with the patient prior the conclusion of today's visit.   F/u one week, for NOB and prenatal care  and eventual [place], vs discussion of miscarriage and future contraception  5) PAP today during exam  Review of ULTRASOUND.    I have personally reviewed images and report of recent ultrasound done at [facility]     Annamarie Major, MD, [facility], [facility] 08/01/2017  2:33 PM   Annamarie Major, MD, [facility], [facility] 08/01/2017  2:03 PM

## 2017-08-02 ENCOUNTER — Other Ambulatory Visit: Payer: Self-pay

## 2017-08-02 DIAGNOSIS — O200 Threatened abortion: Secondary | ICD-10-CM

## 2017-08-03 ENCOUNTER — Encounter: Payer: Self-pay | Admitting: Emergency Medicine

## 2017-08-03 ENCOUNTER — Other Ambulatory Visit: Payer: Self-pay

## 2017-08-03 ENCOUNTER — Emergency Department
Admission: EM | Admit: 2017-08-03 | Discharge: 2017-08-03 | Disposition: A | Discharge status: Home / Self Care | Payer: Self-pay | Attending: Emergency Medicine | Admitting: Emergency Medicine

## 2017-08-03 ENCOUNTER — Telehealth: Payer: Self-pay | Admitting: Obstetrics & Gynecology

## 2017-08-03 DIAGNOSIS — O039 Complete or unspecified spontaneous abortion without complication: Secondary | ICD-10-CM | POA: Insufficient documentation

## 2017-08-03 DIAGNOSIS — F17210 Nicotine dependence, cigarettes, uncomplicated: Secondary | ICD-10-CM | POA: Insufficient documentation

## 2017-08-03 DIAGNOSIS — Z79899 Other long term (current) drug therapy: Secondary | ICD-10-CM | POA: Insufficient documentation

## 2017-08-03 DIAGNOSIS — Z3A00 Weeks of gestation of pregnancy not specified: Secondary | ICD-10-CM | POA: Insufficient documentation

## 2017-08-03 LAB — HCG, QUANTITATIVE, PREGNANCY: hCG, Beta Chain, Quant, S: 290 m[IU]/mL — ABNORMAL HIGH (ref <5)

## 2017-08-03 LAB — BETA HCG QUANT (REF LAB): hCG Quant: 241 m[IU]/mL

## 2017-08-03 NOTE — Discharge Instructions (Signed)
Please follow up with Dr. Tiburcio PeaHarris as we discussed. If you develop extremely heavy vaginal bleeding return to the ED

## 2017-08-03 NOTE — Telephone Encounter (Signed)
Pt is calling needing to find out what she needs to do. [facility]. Pt reports she may have passed the amniotic sac. Please advise

## 2017-08-03 NOTE — ED Triage Notes (Signed)
Pt in via POV, reports being seen on 2/25 with threatened miscarriage, pt states, "I miscarried last night, I thought it was just blood clots but it looked like a sac."  Pt reports being advised to be evaluated here to determine if D&C is needed.  Pt tearful in triage.  Vitals WDL.

## 2017-08-03 NOTE — Telephone Encounter (Signed)
Pt states she has had no more pain or bleeding everything has stopped. Pt aware to let us know if any pain or heavy bleeding occurs

## 2017-08-03 NOTE — ED Provider Notes (Signed)
[facility] Emergency Department Provider Note   ____________________________________________    I have reviewed the triage vital signs and the nursing notes.   HISTORY  Chief Complaint Miscarriage     HPI Martha [name] is a 22 y.o. female who presents with vaginal bleeding.  Patient was seen on February 26 for the same, at that time she had ultrasound which showed gestational sac, followed up with OB/GYN on February 27.  Today she developed worsening bleeding and is concerned that she may have passed products of conception or possibly a blood clot.  Denies significant cramping, bleeding has mostly resolved.  No dizziness or lightheadedness.  She is Rh+ based on records   History reviewed. No pertinent past medical history.  Patient Active Problem List   Diagnosis Date Noted  . Threatened abortion 08/01/2017    History reviewed. No pertinent surgical history.  Prior to Admission medications   Medication Sig Start Date End Date Taking? Authorizing Provider  metroNIDAZOLE (FLAGYL) 500 MG tablet Take 1 tablet (500 mg total) by mouth 2 (two) times daily for 7 days. 08/01/17 08/08/17  Dionne Bucy, MD  ondansetron (ZOFRAN ODT) 4 MG disintegrating tablet Take 1 tablet (4 mg total) by mouth every 8 (eight) hours as needed for nausea or vomiting. 05/19/16   Darci Current, MD     Allergies Patient has no known allergies.  Family History  Problem Relation Age of Onset  . COPD Maternal Grandmother   . Diabetes Maternal Grandfather   . Hypertension Maternal Grandfather     Social History Social History   Tobacco Use  . Smoking status: Current Some Day Smoker    Packs/day: 0.25    Types: Cigarettes  . Smokeless tobacco: Never Used  Substance Use Topics  . Alcohol use: Yes  . Drug use: No    Review of Systems  Constitutional: No fever Eyes: No visual changes.  ENT: No sore throat. Cardiovascular: No dizziness Respiratory:  Denies shortness of breath. Gastrointestinal: No nausea, no vomiting.   Genitourinary: Vaginal bleeding Musculoskeletal: Negative for joint pain Skin: Negative for pallor Neurological: Negative for headaches    ____________________________________________   PHYSICAL EXAM:  VITAL SIGNS: ED Triage Vitals  Enc Vitals Group     BP 08/03/17 1308 135/90     Pulse Rate 08/03/17 1308 98     Resp 08/03/17 1308 16     Temp 08/03/17 1308 98.1 F (36.7 C)     Temp Source 08/03/17 1308 Oral     SpO2 08/03/17 1308 100 %     Weight 08/03/17 1309 71.7 kg (158 lb)     Height 08/03/17 1309 1.626 m (5\' 4" )     Head Circumference --      Peak Flow --      Pain Score 08/03/17 1309 6     Pain Loc --      Pain Edu? --      Excl. in GC? --     Constitutional: Alert and oriented. No acute distress. Pleasant and interactive Eyes: Conjunctivae are normal.    Mouth/Throat: Mucous membranes are moist.    Cardiovascular: Normal rate, regular rhythm.   Good peripheral circulation. Respiratory: Normal respiratory effort.  No retractions. . Gastrointestinal: Soft and nontender. No distention.  Genitourinary: deferred, recent specialist exam Musculoskeletal: Warm and well perfused Neurologic:  Normal speech and language. No gross focal neurologic deficits are appreciated.  Skin:  Skin is warm, dry and intact. No rash noted. Psychiatric:  Mood and affect are normal. Speech and behavior are normal.  ____________________________________________   LABS (all labs ordered are listed, but only abnormal results are displayed)  Labs Reviewed  HCG, QUANTITATIVE, PREGNANCY - Abnormal; Notable for the following components:      Result Value   hCG, Beta Chain, Quant, S 290 (*)    All other components within normal limits    ____________________________________________  EKG  None ____________________________________________  RADIOLOGY   ____________________________________________   PROCEDURES  Procedure(s) performed: No  Procedures   Critical Care performedNo ____________________________________________   INITIAL IMPRESSION / ASSESSMENT AND PLAN / ED COURSE  Pertinent labs & imaging results that were available during my care of the patient were reviewed by me and considered in my medical decision making (see chart for details).  Patient well-appearing in no acute distress, vital signs are unremarkable, hCG has decreased from 342-290 today (although 241 2 days ago at OB/GYN).  This is likely consistent with miscarriage.  However she is not having significant bleeding, dizziness or pain.  She is Rh+  Discussed with her that this is likely a miscarriage and to expect cramping and continued vaginal bleeding but to follow-up with Dr. Tiburcio PeaHarris next week    ____________________________________________   FINAL CLINICAL IMPRESSION(S) / ED DIAGNOSES  Final diagnoses:  Miscarriage        Note:  This document was prepared using Dragon voice recognition software and may include unintentional dictation errors.    Jene EveryKinner, Veva Grimley, MD 08/03/17 [phone]

## 2017-08-05 ENCOUNTER — Encounter: Payer: Self-pay | Admitting: Obstetrics & Gynecology

## 2017-08-06 NOTE — Telephone Encounter (Signed)
Please advise 

## 2017-08-07 LAB — IGP,CTNGTV,RFX APTIMA HPV ASCU
CHLAMYDIA, NUC. ACID AMP: NEGATIVE
Gonococcus, Nuc. Acid Amp: NEGATIVE
PAP Smear Comment: 0
TRICH VAG BY NAA: NEGATIVE

## 2017-08-08 ENCOUNTER — Encounter: Payer: Self-pay | Admitting: Advanced Practice Midwife

## 2017-08-08 ENCOUNTER — Ambulatory Visit (INDEPENDENT_AMBULATORY_CARE_PROVIDER_SITE_OTHER): Payer: Self-pay | Admitting: Advanced Practice Midwife

## 2017-08-08 VITALS — BP 118/74 | Wt 160.0 lb

## 2017-08-08 DIAGNOSIS — O200 Threatened abortion: Secondary | ICD-10-CM

## 2017-08-08 NOTE — Progress Notes (Signed)
S: The patient is here for f/u from ER visit on 3/1 for threatened abortion. She stopped bleeding 2 or 3 days ago. She has some residual lower abdominal muscle pain as she describes it, but no cramping. Her Beta Hcg was 290 6 days ago although it was 241 the day before that. An ultrasound on 2/26 measured 1518w0d by MSD. No Sub-chorionic hemorrhage was noted. Discussion of outcome is likely miscarriage and future fertility/pre-conception health.   O: BP 118/74   Wt 160 lb (72.6 kg)   LMP 05/21/2017   BMI 27.46 kg/m   A: 22 yo G1P0 with likely miscarriage early in 1st trimester  P: Beta Hcg quant today F/u in office PRN  Martha [name], CNM

## 2017-08-09 LAB — BETA HCG QUANT (REF LAB): hCG Quant: 4 m[IU]/mL

## 2017-09-24 ENCOUNTER — Other Ambulatory Visit: Payer: Self-pay

## 2017-09-24 ENCOUNTER — Encounter: Payer: Self-pay | Admitting: Emergency Medicine

## 2017-09-24 ENCOUNTER — Emergency Department
Admission: EM | Admit: 2017-09-24 | Discharge: 2017-09-24 | Disposition: A | Discharge status: Home / Self Care | Payer: Self-pay | Attending: Emergency Medicine | Admitting: Emergency Medicine

## 2017-09-24 ENCOUNTER — Emergency Department: Payer: Self-pay

## 2017-09-24 DIAGNOSIS — O26891 Other specified pregnancy related conditions, first trimester: Secondary | ICD-10-CM | POA: Insufficient documentation

## 2017-09-24 DIAGNOSIS — F17210 Nicotine dependence, cigarettes, uncomplicated: Secondary | ICD-10-CM | POA: Insufficient documentation

## 2017-09-24 DIAGNOSIS — M542 Cervicalgia: Secondary | ICD-10-CM | POA: Insufficient documentation

## 2017-09-24 DIAGNOSIS — O9989 Other specified diseases and conditions complicating pregnancy, childbirth and the puerperium: Secondary | ICD-10-CM | POA: Insufficient documentation

## 2017-09-24 DIAGNOSIS — R1030 Lower abdominal pain, unspecified: Secondary | ICD-10-CM | POA: Insufficient documentation

## 2017-09-24 DIAGNOSIS — H9202 Otalgia, left ear: Secondary | ICD-10-CM | POA: Insufficient documentation

## 2017-09-24 DIAGNOSIS — O99332 Smoking (tobacco) complicating pregnancy, second trimester: Secondary | ICD-10-CM | POA: Insufficient documentation

## 2017-09-24 DIAGNOSIS — B9689 Other specified bacterial agents as the cause of diseases classified elsewhere: Secondary | ICD-10-CM

## 2017-09-24 DIAGNOSIS — O23591 Infection of other part of genital tract in pregnancy, first trimester: Secondary | ICD-10-CM | POA: Insufficient documentation

## 2017-09-24 DIAGNOSIS — O99331 Smoking (tobacco) complicating pregnancy, first trimester: Secondary | ICD-10-CM | POA: Insufficient documentation

## 2017-09-24 DIAGNOSIS — R109 Unspecified abdominal pain: Secondary | ICD-10-CM

## 2017-09-24 DIAGNOSIS — Z3A18 18 weeks gestation of pregnancy: Secondary | ICD-10-CM | POA: Insufficient documentation

## 2017-09-24 DIAGNOSIS — N760 Acute vaginitis: Secondary | ICD-10-CM

## 2017-09-24 DIAGNOSIS — Z3A01 Less than 8 weeks gestation of pregnancy: Secondary | ICD-10-CM | POA: Insufficient documentation

## 2017-09-24 LAB — URINALYSIS, COMPLETE (UACMP) WITH MICROSCOPIC
Bilirubin Urine: NEGATIVE
GLUCOSE, UA: NEGATIVE mg/dL
Hgb urine dipstick: NEGATIVE
Ketones, ur: 20 mg/dL — AB
Leukocytes, UA: NEGATIVE
NITRITE: NEGATIVE
PROTEIN: NEGATIVE mg/dL
SPECIFIC GRAVITY, URINE: 1.013 (ref 1.005–1.030)
pH: 6 (ref 5.0–8.0)

## 2017-09-24 LAB — OB RESULTS CONSOLE GC/CHLAMYDIA
Chlamydia: NEGATIVE
Gonorrhea: NEGATIVE

## 2017-09-24 LAB — WET PREP, GENITAL
SPERM: NONE SEEN
Trich, Wet Prep: NONE SEEN
Yeast Wet Prep HPF POC: NONE SEEN

## 2017-09-24 LAB — CHLAMYDIA/NGC RT PCR (ARMC ONLY)
CHLAMYDIA TR: NOT DETECTED
N gonorrhoeae: NOT DETECTED

## 2017-09-24 LAB — HCG, QUANTITATIVE, PREGNANCY: HCG, BETA CHAIN, QUANT, S: 70178 m[IU]/mL — AB (ref <5)

## 2017-09-24 LAB — POCT PREGNANCY, URINE: Preg Test, Ur: POSITIVE — AB

## 2017-09-24 MED ORDER — METOCLOPRAMIDE HCL 10 MG PO TABS: 10.0000 mg | ORAL_TABLET | Freq: Three times a day (TID) | ORAL | 0 refills | Status: DC | PRN

## 2017-09-24 MED ORDER — ACETAMINOPHEN 325 MG PO TABS
650.0000 mg | ORAL_TABLET | Freq: Once | ORAL | Status: AC
  Administered 2017-09-24: 650 mg via ORAL
  Filled 2017-09-24: qty 2

## 2017-09-24 MED ORDER — METRONIDAZOLE 500 MG PO TABS: 500.0000 mg | ORAL_TABLET | Freq: Two times a day (BID) | ORAL | 0 refills | Status: AC

## 2017-09-24 MED ORDER — ACETIC ACID 2 % OT SOLN
4.0000 [drp] | Freq: Once | OTIC | Status: DC
  Filled 2017-09-24: qty 15

## 2017-09-24 MED ORDER — ACETIC ACID 2 % OT SOLN: 4.0000 [drp] | Freq: Four times a day (QID) | OTIC | 0 refills | Status: DC

## 2017-09-24 MED ORDER — ANTIPYRINE-BENZOCAINE 5.4-1.4 % OT SOLN: 3.0000 [drp] | Freq: Once | OTIC | Status: DC

## 2017-09-24 NOTE — ED Notes (Signed)
Pt states that she is having stomach cramps. She was worried that she might be having a miscarriage. PT is alert and oriented x 4.  Mother at bedside.

## 2017-09-24 NOTE — ED Triage Notes (Addendum)
Pt reports left ear ache x3 days, reports soreness down left side of neck. Pt is approx 18 weeks.

## 2017-09-24 NOTE — Discharge Instructions (Signed)
Advised extra strength Tylenol for pain and to address become affected.

## 2017-09-24 NOTE — ED Provider Notes (Addendum)
[facility] Emergency Department Provider Note ____________________________________________   First MD Initiated Contact with Patient 09/24/17 1507     (approximate)  I have reviewed the triage vital signs and the nursing notes.   HISTORY  Chief Complaint Abdominal Pain    HPI Martha [name] is a 22 y.o. female with PMH as noted below who states that she is pregnant (this would be G2, P0) at unknown gestational age (patient states her last normal period was in December) with periumbilical and lower abdominal crampy pain over the last day, associated with nausea and one episode of vomiting, not associated with diarrhea, fever, or dysuria.  Patient does report some whitish vaginal discharge.  The patient states that she had a miscarriage in March, and has been sexually active since that time.  She states that the pain feels similar to when she had a miscarriage.   History reviewed. No pertinent past medical history.  Patient Active Problem List   Diagnosis Date Noted  . Threatened abortion 08/01/2017    History reviewed. No pertinent surgical history.  Prior to Admission medications   Medication Sig Start Date End Date Taking? Authorizing Provider  acetic acid 2 % otic solution Place 4 drops into the left ear 4 (four) times daily. Patient not taking: Reported on 09/24/2017 09/24/17   Joni Reining, PA-C  ondansetron (ZOFRAN ODT) 4 MG disintegrating tablet Take 1 tablet (4 mg total) by mouth every 8 (eight) hours as needed for nausea or vomiting. Patient not taking: Reported on 08/08/2017 05/19/16   Darci Current, MD    Allergies Patient has no known allergies.  Family History  Problem Relation Age of Onset  . COPD Maternal Grandmother   . Diabetes Maternal Grandfather   . Hypertension Maternal Grandfather     Social History Social History   Tobacco Use  . Smoking status: Current Some Day Smoker    Packs/day: 0.25    Types: Cigarettes   . Smokeless tobacco: Never Used  Substance Use Topics  . Alcohol use: Yes  . Drug use: No    Review of Systems  Constitutional: No fever. Eyes: No redness. ENT: No sore throat. Cardiovascular: Denies chest pain. Respiratory: Denies shortness of breath. Gastrointestinal: Positive for nausea and vomiting.  Genitourinary: Negative for dysuria.  Positive for vaginal discharge. Musculoskeletal: Negative for back pain. Skin: Negative for rash. Neurological: Negative for headache.   ____________________________________________   PHYSICAL EXAM:  VITAL SIGNS: ED Triage Vitals  Enc Vitals Group     BP 09/24/17 1319 113/85     Pulse Rate 09/24/17 1319 86     Resp 09/24/17 1319 18     Temp 09/24/17 1319 98.2 F (36.8 C)     Temp Source 09/24/17 1319 Oral     SpO2 09/24/17 1319 100 %     Weight 09/24/17 1323 165 lb (74.8 kg)     Height 09/24/17 1323 5\' 3"  (1.6 m)     Head Circumference --      Peak Flow --      Pain Score 09/24/17 1322 5     Pain Loc --      Pain Edu? --      Excl. in GC? --     Constitutional: Alert and oriented. Well appearing and in no acute distress. Eyes: Conjunctivae are normal.  No scleral icterus. Head: Atraumatic. Nose: No congestion/rhinnorhea. Mouth/Throat: Mucous membranes are moist.   Neck: Normal range of motion.  Cardiovascular:  Good peripheral  circulation. Respiratory: Normal respiratory effort.   Gastrointestinal: Soft and nontender.  Mild suprapubic discomfort. No distention.  Genitourinary: No CVA tenderness. Musculoskeletal: Extremities warm and well perfused.  Neurologic:  Normal speech and language. No gross focal neurologic deficits are appreciated.  Skin:  Skin is warm and dry. No rash noted. Psychiatric: Mood and affect are normal. Speech and behavior are normal.  ____________________________________________   LABS (all labs ordered are listed, but only abnormal results are displayed)  Labs Reviewed  WET PREP, GENITAL -  Abnormal; Notable for the following components:      Result Value   Clue Cells Wet Prep HPF POC PRESENT (*)    WBC, Wet Prep HPF POC MANY (*)    All other components within normal limits  HCG, QUANTITATIVE, PREGNANCY - Abnormal; Notable for the following components:   hCG, Beta Chain, Quant, S 70,178 (*)    All other components within normal limits  URINALYSIS, COMPLETE (UACMP) WITH MICROSCOPIC - Abnormal; Notable for the following components:   Color, Urine YELLOW (*)    APPearance HAZY (*)    Ketones, ur 20 (*)    Squamous Epithelial / LPF 6-30 (*)    All other components within normal limits  POCT PREGNANCY, URINE - Abnormal; Notable for the following components:   Preg Test, Ur POSITIVE (*)    All other components within normal limits  CHLAMYDIA/NGC RT PCR ([facility] ONLY)  POC URINE PREG, ED   ____________________________________________  EKG   ____________________________________________  RADIOLOGY  US pelvis: Live IUP consistent with 6 weeks 3 days gestational  ____________________________________________   PROCEDURES  Procedure(s) performed: No  Procedures  Critical Care performed: No ____________________________________________   INITIAL IMPRESSION / ASSESSMENT AND PLAN / ED COURSE  Pertinent labs & imaging results that were available during my care of the patient were reviewed by me and considered in my medical decision making (see chart for details).  22 year old female who is apparently G2, P0 at unknown gestational age presents with abdominal cramping, some vaginal discharge, concern she may be having a miscarriage.  Patient denies vaginal bleeding.  She is well-appearing on exam, vital signs are normal, and her abdomen is soft and nontender.  I reviewed the past medical records in epic; the patient was seen in the ED on 07/31/2017 and 08/03/2017 due to lower abdominal cramping with hCG around 300.  Ultrasound at that time showed gestational sac but  no definitive IUP.  She was diagnosed with BV, and states that after the second visit she passed products of conception.  Today her hCG is positive at 70,000.  It appears most likely the patient did in fact have a miscarriage, and now has a new pregnancy although it is also possible that she merely had a threatened miscarriage at that time and this is a continuation of the same pregnancy.  Plan: Ultrasound, UA, vaginal exam to evaluate the discharge, and reassess.    ----------------------------------------- 6:26 PM on 09/24/2017 -----------------------------------------  Ultrasound shows live IUP consistent with 6 weeks 3 days gestation.  Patient is noted to be Rh+, but she does not have bleeding at this time.  Her UA shows no evidence of UTI.  Her pelvic exam shows some foamy whitish discharge, and it is positive for clue cells.  I will treat the patient for BV.  Patient will require outpatient OB/GYN referral which we will provide as well.  Return precautions given, and she expresses understanding.  ____________________________________________   FINAL CLINICAL IMPRESSION(S) / ED DIAGNOSES  Final diagnoses:  Abdominal pain during pregnancy in first trimester      NEW MEDICATIONS STARTED DURING THIS VISIT:  New Prescriptions   No medications on file     Note:  This document was prepared using Dragon voice recognition software and may include unintentional dictation errors.        Dionne Bucy, MD 09/24/17 [phone]

## 2017-09-24 NOTE — ED Provider Notes (Signed)
[facility] Emergency Department Provider Note   ____________________________________________   First MD Initiated Contact with Patient 09/24/17 0827     (approximate)  I have reviewed the triage vital signs and the nursing notes.   HISTORY  Chief Complaint Otalgia    HPI Martha [name] is a 22 y.o. female patient complain of left ear pain for 3 days.  Patient has soreness left side of neck.  Recent history of clean ear canal with Q-tips.  Patient is 18 weeks of gestation.  Patient rates the pain as a 7/10.  Patient described the pain is "aching".  No palliative measures for complaint.   History reviewed. No pertinent past medical history.  Patient Active Problem List   Diagnosis Date Noted  . Threatened abortion 08/01/2017    No past surgical history on file.  Prior to Admission medications   Medication Sig Start Date End Date Taking? Authorizing Provider  acetic acid 2 % otic solution Place 4 drops into the left ear 4 (four) times daily. 09/24/17   Joni Reining, PA-C  ondansetron (ZOFRAN ODT) 4 MG disintegrating tablet Take 1 tablet (4 mg total) by mouth every 8 (eight) hours as needed for nausea or vomiting. Patient not taking: Reported on 08/08/2017 05/19/16   Darci Current, MD    Allergies Patient has no known allergies.  Family History  Problem Relation Age of Onset  . COPD Maternal Grandmother   . Diabetes Maternal Grandfather   . Hypertension Maternal Grandfather     Social History Social History   Tobacco Use  . Smoking status: Current Some Day Smoker    Packs/day: 0.25    Types: Cigarettes  . Smokeless tobacco: Never Used  Substance Use Topics  . Alcohol use: Yes  . Drug use: No    Review of Systems Constitutional: No fever/chills Eyes: No visual changes. ENT: Left ear pain.   Cardiovascular: Denies chest pain. Respiratory: Denies shortness of breath. Gastrointestinal: No abdominal pain.  No nausea, no  vomiting.  No diarrhea.  No constipation. Genitourinary: Negative for dysuria. Musculoskeletal: Negative for back pain. Skin: Negative for rash. Neurological: Negative for headaches, focal weakness or numbness.   ____________________________________________   PHYSICAL EXAM:  VITAL SIGNS: ED Triage Vitals  Enc Vitals Group     BP 09/24/17 0814 (!) 139/53     Pulse Rate 09/24/17 0814 93     Resp 09/24/17 0814 17     Temp 09/24/17 0814 98 F (36.7 C)     Temp Source 09/24/17 0814 Oral     SpO2 09/24/17 0814 99 %     Weight 09/24/17 0815 165 lb (74.8 kg)     Height 09/24/17 0815 5\' 3"  (1.6 m)     Head Circumference --      Peak Flow --      Pain Score 09/24/17 0815 7     Pain Loc --      Pain Edu? --      Excl. in GC? --    Constitutional: Alert and oriented. Well appearing and in no acute distress. EARS: Edematous erythematous right ear canal.  TM not visible. Mouth/Throat: Mucous membranes are moist.  Oropharynx non-erythematous. Neck: No stridor.  Hematological/Lymphatic/Immunilogical: No cervical lymphadenopathy. Cardiovascular: Normal rate, regular rhythm. Grossly normal heart sounds.  Good peripheral circulation. Respiratory: Normal respiratory effort.  No retractions. Lungs CTAB. Skin:  Skin is warm, dry and intact. No rash noted. Psychiatric: Mood and affect are normal. Speech and behavior  are normal.  ____________________________________________   LABS (all labs ordered are listed, but only abnormal results are displayed)  Labs Reviewed - No data to display ____________________________________________  EKG   ____________________________________________  RADIOLOGY  ED MD interpretation:    Official radiology report(s): No results found.  ____________________________________________   PROCEDURES  Procedure(s) performed: None  Procedures  Critical Care performed: No  ____________________________________________   INITIAL IMPRESSION /  ASSESSMENT AND PLAN / ED COURSE  As part of my medical decision making, I reviewed the following data within the electronic MEDICAL RECORD NUMBER    Left ear pain secondary to otitis external.  Patient given discharge care instruction.  Patient advised use eardrops as directed.  Patient advised to follow-up PCP if condition persists.      ____________________________________________   FINAL CLINICAL IMPRESSION(S) / ED DIAGNOSES  Final diagnoses:  Otalgia of left ear     ED Discharge Orders        Ordered    acetic acid 2 % otic solution  4 times daily     09/24/17 0854       Note:  This document was prepared using Dragon voice recognition software and may include unintentional dictation errors.    Joni ReiningSmith, Meghan Tiemann K, PA-C 09/24/17 [id number]    Arnaldo NatalMalinda, Paul F, MD 09/24/17 [phone]

## 2017-09-24 NOTE — Discharge Instructions (Signed)
Take the antibiotic (metronidazole) as prescribed and finish the full course.  You can take the metoclopramide for nausea as needed.  We have provided referral to an OB/GYN at our [facility].  Return to the ER for new, worsening, persistent pain, discharge, bleeding, fevers, weakness, persistent vomiting, or any other new or worsening symptoms that concern you.

## 2017-09-24 NOTE — ED Triage Notes (Addendum)
Pt presents to ED c/o pelvic pain/cramping starting last night. States last time she felt this she was having a miscarriage. Reports she is approx. [redacted] weeks pregnant. Denies vaginal bleeding, reports some clear/white discharge x1.5 wks. Pt also seen in this ED earlier today for otalgia.

## 2017-10-09 ENCOUNTER — Other Ambulatory Visit: Payer: Self-pay | Admitting: Nurse Practitioner

## 2017-10-09 DIAGNOSIS — Z369 Encounter for antenatal screening, unspecified: Secondary | ICD-10-CM

## 2017-10-09 LAB — OB RESULTS CONSOLE RPR: RPR: NONREACTIVE

## 2017-10-09 LAB — OB RESULTS CONSOLE HGB/HCT, BLOOD
HCT: 37 (ref 29–41)
HEMOGLOBIN: 12.4

## 2017-10-09 LAB — OB RESULTS CONSOLE ABO/RH: RH TYPE: POSITIVE

## 2017-10-09 LAB — OB RESULTS CONSOLE HEPATITIS B SURFACE ANTIGEN: HEP B S AG: NEGATIVE

## 2017-10-09 LAB — OB RESULTS CONSOLE PLATELET COUNT: Platelets: 333000

## 2017-10-09 LAB — OB RESULTS CONSOLE ANTIBODY SCREEN: ANTIBODY SCREEN: NEGATIVE

## 2017-10-09 LAB — OB RESULTS CONSOLE HIV ANTIBODY (ROUTINE TESTING): HIV: NONREACTIVE

## 2017-11-05 ENCOUNTER — Ambulatory Visit
Admission: RE | Admit: 2017-11-05 | Discharge: 2017-11-05 | Disposition: A | Discharge status: Home / Self Care | Payer: Medicaid Other | Source: Ambulatory Visit | Attending: Obstetrics and Gynecology | Admitting: Obstetrics and Gynecology

## 2017-11-05 ENCOUNTER — Ambulatory Visit (HOSPITAL_BASED_OUTPATIENT_CLINIC_OR_DEPARTMENT_OTHER)
Admission: RE | Admit: 2017-11-05 | Discharge: 2017-11-05 | Disposition: A | Discharge status: Home / Self Care | Payer: Medicaid Other | Source: Ambulatory Visit | Attending: Obstetrics and Gynecology | Admitting: Obstetrics and Gynecology

## 2017-11-05 VITALS — BP 104/71 | HR 74 | Temp 97.8°F | Resp 17 | Ht 64.0 in | Wt 164.4 lb

## 2017-11-05 DIAGNOSIS — Z810 Family history of intellectual disabilities: Secondary | ICD-10-CM

## 2017-11-05 DIAGNOSIS — Z363 Encounter for antenatal screening for malformations: Secondary | ICD-10-CM | POA: Diagnosis not present

## 2017-11-05 DIAGNOSIS — Z3689 Encounter for other specified antenatal screening: Secondary | ICD-10-CM

## 2017-11-05 DIAGNOSIS — Z369 Encounter for antenatal screening, unspecified: Secondary | ICD-10-CM

## 2017-11-05 DIAGNOSIS — Z3682 Encounter for antenatal screening for nuchal translucency: Secondary | ICD-10-CM | POA: Diagnosis not present

## 2017-11-05 NOTE — Progress Notes (Addendum)
Referring physician:  [facility] Department Length of Consultation: 40 minutes   Martha [name]  was referred to [place] for genetic counseling to review prenatal screening and testing options and her family history.  This note summarizes the information we discussed.    We offered the following routine screening tests for this pregnancy:  First trimester screening, which includes nuchal translucency ultrasound screen and first trimester maternal serum marker screening.  The nuchal translucency has approximately an 80% detection rate for Down syndrome and can be positive for other chromosome abnormalities as well as congenital heart defects.  When combined with a maternal serum marker screening, the detection rate is up to 90% for Down syndrome and up to 97% for trisomy 18.     Maternal serum marker screening, a blood test that measures pregnancy proteins, can provide risk assessments for Down syndrome, trisomy 18, and open neural tube defects (spina bifida, anencephaly). Because it does not directly examine the fetus, it cannot positively diagnose or rule out these problems.  Targeted ultrasound uses high frequency sound waves to create an image of the developing fetus.  An ultrasound is often recommended as a routine means of evaluating the pregnancy.  It is also used to screen for fetal anatomy problems (for example, a heart defect) that might be suggestive of a chromosomal or other abnormality.   Should these screening tests indicate an increased concern, then the following additional testing options would be offered:  The chorionic villus sampling procedure is available for first trimester chromosome analysis.  This involves the withdrawal of a small amount of chorionic villi (tissue from the developing placenta).  Risk of pregnancy loss is estimated to be approximately 1 in 200 to 1 in 100 (0.5 to 1%).  There is approximately a 1% (1 in 100) chance  that the CVS chromosome results will be unclear.  Chorionic villi cannot be tested for neural tube defects.     Amniocentesis involves the removal of a small amount of amniotic fluid from the sac surrounding the fetus with the use of a thin needle inserted through the maternal abdomen and uterus.  Ultrasound guidance is used throughout the procedure.  Fetal cells from amniotic fluid are directly evaluated and > 99.5% of chromosome problems and > 98% of open neural tube defects can be detected. This procedure is generally performed after the 15th week of pregnancy.  The main risks to this procedure include complications leading to miscarriage in less than 1 in 200 cases (0.5%).  As another option for information if the pregnancy is suspected to be an an increased chance for certain chromosome conditions, we also reviewed the availability of cell free fetal DNA testing from maternal blood to determine whether or not the baby may have either Down syndrome, trisomy 99, or trisomy 12.  This test utilizes a maternal blood sample and DNA sequencing technology to isolate circulating cell free fetal DNA from maternal plasma.  The fetal DNA can then be analyzed for DNA sequences that are derived from the three most common chromosomes involved in aneuploidy, chromosomes 13, 18, and 21.  If the overall amount of DNA is greater than the expected level for any of these chromosomes, aneuploidy is suspected.  While we do not consider it a replacement for invasive testing and karyotype analysis, a negative result from this testing would be reassuring, though not a guarantee of a normal chromosome complement for the baby.  An abnormal result is certainly suggestive  of an abnormal chromosome complement, though we would still recommend CVS or amniocentesis to confirm any findings from this testing.  Cystic Fibrosis and Spinal Muscular Atrophy (SMA) screening were also discussed with the patient. Both conditions are recessive,  which means that both parents must be carriers in order to have a child with the disease.  Cystic fibrosis (CF) is one of the most common genetic conditions in persons of Caucasian ancestry.  This condition occurs in approximately 1 in 2,500 Caucasian persons and results in thickened secretions in the lungs, digestive, and reproductive systems.  For a baby to be at risk for having CF, both of the parents must be carriers for this condition.  Approximately 1 in 49 Caucasian persons is a carrier for CF.  Current carrier testing looks for the most common mutations in the gene for CF and can detect approximately 90% of carriers in the Caucasian population.  This means that the carrier screening can greatly reduce, but cannot eliminate, the chance for an individual to have a child with CF.  If an individual is found to be a carrier for CF, then carrier testing would be available for the [name]. As part of [place] newborn screening profile, all babies born in the state of [place] will have a two-tier screening process.  Specimens are first tested to determine the concentration of immunoreactive trypsinogen (IRT).  The top 5% of specimens with the highest IRT values then undergo DNA testing using a panel of over 40 common CF mutations. SMA is a neurodegenerative disorder that leads to atrophy of skeletal muscle and overall weakness.  This condition is also more prevalent in the Caucasian population, with 1 in 40-1 in 60 persons being a carrier and 1 in 6,000-1 in 10,000 children being affected.  There are multiple forms of the disease, with some causing death in infancy to other forms with survival into adulthood.  The genetics of SMA is complex, but carrier screening can detect up to 95% of carriers in the Caucasian population.  Similar to CF, a negative result can greatly reduce, but cannot eliminate, the chance to have a child with SMA.  We obtained a detailed family history and pregnancy history.   The patient reported one maternal half brother with developmental delays and mental health concerns (bipolar, depression, anxiety).  He was born at 26-[redacted] weeks gestation following placental abruption and the family has been told that his delays are related to his prematurity.  If this is the cause, then we would not expect other family members to be at increased risk for developmental concerns.  Many other family members are reported to have mental health diagnoses including the patient, her maternal aunt, maternal grandmother and paternal aunt.  The father of the baby also have a brother with bipolar disorder and mental health diagnoses in his mother and maternal cousins.  We reviewed that at this time, the specific cause for mental health conditions is not clearly understood.  However, we do know that in some families, there appear to be strong inherited factors which are likely important in the development of these conditions.  Given the history, we would expect that this pregnancy is at increased risk for having a similar condition, though genetic testing is not currently available.  The father of the baby is reported to have a paternal first cousin with Down syndrome and a paternal aunt with a degenerative nerve problem.  We would need more information about the nerve condition in order to  provide any additional medical information about risks to other family members.  Down syndrome is caused by an extra copy of the genetic instructions located on chromosome number 21.  These extra instructions result in the characteristic facial appearance, intellectual disabilities, and an increased risk for some types of birth defects.  The majority (95%) of persons with Down syndrome have three freestanding copies of chromosome number 21, called trisomy 59.  This type of Down syndrome occurs as a sporadic condition and does not increase the chance for other family members to have Down syndrome.  Rarely, Down syndrome is  caused by a rearrangement of the genetic instructions, where the extra chromosome number 21 is attached to another chromosome.  This type of chromosome rearrangement can be passed down through families and therefore may increase the chance for Down syndrome in other family members.  We cannot determine which type of Down syndrome is present without documentation of chromosome studies in the affected family member.  If any additional information is obtained about this history, please do not hesitate to contact us.  The remainder of the family history was reported to be unremarkable for birth defects, intellectual delays, recurrent pregnancy loss or known chromosome abnormalities.  Martha [name] stated that this is the second pregnancy for Martha [name], Martha [name].  Their first pregnancy resulted in an early miscarriage.  Martha reported no complications in this pregnancy that would be expected to increase the risk for birth defects.  The patient denied any medications, alcohol use or tobacco in this pregnancy but is smoking marijuana occasionally.  The use of marijuana in pregnancy is known to be associated with low birth weight and premature delivery.  We therefore suggested the patient avoid smoking marijuana during this time.  After consideration of the options, Martha [name] elected to proceed with first trimester screening and to declined CF and SMA carrier screening.  An ultrasound was performed at the time of the visit.  The gestational age was consistent with 12 weeks.  Fetal anatomy could not be assessed due to early gestational age.  Please refer to the ultrasound report for details of that study.  Martha [name] was encouraged to call with questions or concerns.  We can be contacted at [phone].  Labs ordered:  First trimester screening  Wilburt Finlay, MS, CGC  I met with the pt and agree with the plan as outlined by the genetic counselor  Gatha Mayer, MD

## 2017-11-12 ENCOUNTER — Telehealth: Payer: Self-pay | Admitting: Obstetrics and Gynecology

## 2017-11-12 NOTE — Telephone Encounter (Signed)
  [name] elected to undergo First Trimester screening on 11/05/2017.  To review, first trimester screening, includes nuchal translucency ultrasound screen and/or first trimester maternal serum marker screening.  The nuchal translucency has approximately an 80% detection rate for Down syndrome and can be positive for other chromosome abnormalities as well as heart defects.  When combined with a maternal serum marker screening, the detection rate is up to 90% for Down syndrome and up to 97% for trisomy 13 and 18.     The results of the First Trimester Nuchal Translucency and Biochemical Screening were within normal range.  The risk for Down syndrome is now estimated to be less than 1 in 10,000.  The risk for Trisomy 13/18 is also estimated to be less than 1 in 10,000.  Should more definitive information be desired, we would offer amniocentesis.  Because we do not yet know the effectiveness of combined first and second trimester screening, we do not recommend a maternal serum screen to assess the chance for chromosome conditions.  However, if screening for neural tube defects is desired, maternal serum screening for AFP only can be performed between 15 and [redacted] weeks gestation.      Cherly Andersoneborah F. Daren Doswell, MS, CGC

## 2017-12-03 ENCOUNTER — Other Ambulatory Visit: Payer: Self-pay | Admitting: *Deleted

## 2017-12-03 DIAGNOSIS — O99332 Smoking (tobacco) complicating pregnancy, second trimester: Principal | ICD-10-CM

## 2017-12-03 DIAGNOSIS — F17200 Nicotine dependence, unspecified, uncomplicated: Secondary | ICD-10-CM

## 2017-12-17 ENCOUNTER — Ambulatory Visit
Admission: RE | Admit: 2017-12-17 | Discharge: 2017-12-17 | Disposition: A | Discharge status: Home / Self Care | Payer: Medicaid Other | Source: Ambulatory Visit | Attending: Obstetrics & Gynecology | Admitting: Obstetrics & Gynecology

## 2017-12-17 DIAGNOSIS — F17200 Nicotine dependence, unspecified, uncomplicated: Secondary | ICD-10-CM | POA: Insufficient documentation

## 2017-12-17 DIAGNOSIS — Z3A18 18 weeks gestation of pregnancy: Secondary | ICD-10-CM | POA: Insufficient documentation

## 2017-12-17 DIAGNOSIS — O99332 Smoking (tobacco) complicating pregnancy, second trimester: Secondary | ICD-10-CM | POA: Insufficient documentation

## 2017-12-27 ENCOUNTER — Other Ambulatory Visit: Payer: Self-pay | Admitting: *Deleted

## 2017-12-27 DIAGNOSIS — O99332 Smoking (tobacco) complicating pregnancy, second trimester: Secondary | ICD-10-CM

## 2017-12-31 ENCOUNTER — Ambulatory Visit
Admission: RE | Admit: 2017-12-31 | Discharge: 2017-12-31 | Disposition: A | Discharge status: Home / Self Care | Payer: Medicaid Other | Source: Ambulatory Visit | Attending: Maternal and Fetal Medicine | Admitting: Maternal and Fetal Medicine

## 2017-12-31 DIAGNOSIS — O99332 Smoking (tobacco) complicating pregnancy, second trimester: Secondary | ICD-10-CM

## 2017-12-31 DIAGNOSIS — Z3A20 20 weeks gestation of pregnancy: Secondary | ICD-10-CM | POA: Insufficient documentation

## 2018-01-11 ENCOUNTER — Encounter: Payer: Self-pay | Admitting: Obstetrics and Gynecology

## 2018-02-01 ENCOUNTER — Encounter: Payer: Self-pay | Admitting: Advanced Practice Midwife

## 2018-02-01 ENCOUNTER — Ambulatory Visit (INDEPENDENT_AMBULATORY_CARE_PROVIDER_SITE_OTHER): Payer: Medicaid Other | Admitting: Advanced Practice Midwife

## 2018-02-01 VITALS — BP 104/70 | Wt 180.0 lb

## 2018-02-01 DIAGNOSIS — O09299 Supervision of pregnancy with other poor reproductive or obstetric history, unspecified trimester: Secondary | ICD-10-CM | POA: Insufficient documentation

## 2018-02-01 DIAGNOSIS — Z131 Encounter for screening for diabetes mellitus: Secondary | ICD-10-CM

## 2018-02-01 DIAGNOSIS — Z130 Encounter for screening for diseases of the blood and blood-forming organs and certain disorders involving the immune mechanism: Secondary | ICD-10-CM

## 2018-02-01 DIAGNOSIS — O0970 Supervision of high risk pregnancy due to social problems, unspecified trimester: Secondary | ICD-10-CM | POA: Insufficient documentation

## 2018-02-01 DIAGNOSIS — Z113 Encounter for screening for infections with a predominantly sexual mode of transmission: Secondary | ICD-10-CM

## 2018-02-01 HISTORY — DX: Supervision of high risk pregnancy due to social problems, unspecified trimester: O09.70

## 2018-02-01 LAB — POCT URINALYSIS DIPSTICK OB
GLUCOSE, UA: NEGATIVE
POC,PROTEIN,UA: NEGATIVE

## 2018-02-01 NOTE — Patient Instructions (Signed)
Exercise During Pregnancy For people of all ages, exercise is an important part of being healthy. Exercise improves heart and lung function and helps to maintain strength, flexibility, and a healthy body weight. Exercise also boosts energy levels and elevates mood. For most women, maintaining an exercise routine throughout pregnancy is recommended. It is only on rare occasions and with certain medical conditions or pregnancy complications that women may be asked to limit or avoid exercise during pregnancy. What are some other benefits to exercising during pregnancy? Along with maintaining strength and flexibility, exercising throughout pregnancy can help to:  Keep strength in muscles that are very important during labor and childbirth.  Decrease low back pain during pregnancy.  Decrease the risk of developing gestational diabetes mellitus (GDM).  Improve blood sugar (glucose) control for women who have GDM.  Decrease the risk of developing preeclampsia. This is a serious condition that causes high blood pressure along with other symptoms, such as swelling and headaches.  Decrease the risk of cesarean delivery.  Speed up the recovery after giving birth.  How often should I exercise? Unless your health care provider gives you different instructions, you should try to exercise on most days or all days of the week. In general, try to exercise with moderate intensity for about 150 minutes per week. This can be spread out across several days, such as exercising for 30 minutes per day on 5 days of each week. You can tell that you are exercising at a moderate intensity if you have a higher heart rate and faster breathing, but you are still able to hold a conversation. What types of moderate-intensity exercise are recommended during pregnancy? There are many types of exercise that are safe for you to do during pregnancy. Unless your health care provider gives you different instructions, do a variety of  exercises that safely increase your heart and breathing (cardiopulmonary) rates and help you to build and maintain muscle strength (strength training). You should always be able to talk in full sentences while exercising during pregnancy. Some examples of exercising that is safe to do during pregnancy include:  Brisk walking or hiking.  Swimming.  Water aerobics.  Riding a stationary bike.  Strength training.  Modified yoga or Pilates. Tell your instructor that you are pregnant. Avoid overstretching and avoid lying on your back for long periods of time.  Running or jogging. Only choose this type of exercise if: ? You ran or jogged regularly before your pregnancy. ? You can run or jog and still talk in complete sentences.  What types of exercise should I not do during pregnancy? Depending on your level of fitness and whether you exercised regularly before your pregnancy, you may be advised to limit vigorous-intensity exercise during your pregnancy. You can tell that you are exercising at a vigorous intensity if you are breathing much harder and faster and cannot hold a conversation while exercising. Some examples of exercising that you should avoid during pregnancy include:  Contact sports.  Activities that place you at risk for falling on or being hit in the belly, such as downhill skiing, water skiing, surfing, rock climbing, cycling, gymnastics, and horseback riding.  Scuba diving.  Sky diving.  Yoga or Pilates in a room that is heated to extreme temperatures ("hot yoga" or "hot Pilates").  Jogging or running, unless you ran or jogged regularly before your pregnancy. While jogging or running, you should always be able to talk in full sentences. Do not run or jog so vigorously   that you are unable to have a conversation.  If you are not used to exercising at elevation (more than 6,000 feet above sea level), do not do so during your pregnancy.  When should I avoid exercising  during pregnancy? Certain medical conditions can make it unsafe to exercise during pregnancy, or they may increase your risk of miscarriage or early labor and birth. Some of these conditions include:  Some types of heart disease.  Some types of lung disease.  Placenta previa. This is when the placenta partially or completely covers the opening of the uterus (cervix).  Frequent bleeding from the vagina during your pregnancy.  Incompetent cervix. This is when your cervix does not remain as tightly closed during pregnancy as it should.  Premature labor.  Ruptured membranes. This is when the protective sac (amniotic sac) opens up and amniotic fluid leaks from your vagina.  Severely low blood count (anemia).  Preeclampsia or pregnancy-caused high blood pressure.  Carrying more than one baby (multiple gestation) and having an additional risk of early labor.  Poorly controlled diabetes.  Being severely underweight or severely overweight.  Intrauterine growth restriction. This is when your baby's growth and development during pregnancy are slower than expected.  Other medical conditions. Ask your health care provider if any apply to you.  What else should I know about exercising during pregnancy? You should take these precautions while exercising during pregnancy:  Avoid overheating. ? Wear loose-fitting, breathable clothes. ? Do not exercise in very high temperatures.  Avoid dehydration. Drink enough water before, during, and after exercise to keep your urine clear or pale yellow.  Avoid overstretching. Because of hormone changes during pregnancy, it is easy to overstretch muscles, tendons, and ligaments during pregnancy.  Start slowly and ask your health care provider to recommend types of exercise that are safe for you, if exercising regularly is new for you.  Pregnancy is not a time for exercising to lose weight. When should I seek medical care? You should stop exercising  and call your health care provider if you have any unusual symptoms, such as:  Mild uterine contractions or abdominal cramping.  Dizziness that does not improve with rest.  When should I seek immediate medical care? You should stop exercising and call your local emergency services (911 in the [place] if you have any unusual symptoms, such as:  Sudden, severe pain in your low back or your belly.  Uterine contractions or abdominal cramping that do not improve with rest.  Chest pain.  Bleeding or fluid leaking from your vagina.  Shortness of breath.  This information is not intended to replace advice given to you by your health care provider. Make sure you discuss any questions you have with your health care provider. Document Released: 05/22/2005 Document Revised: 10/20/2015 Document Reviewed: 07/30/2014 [employer] Interactive Patient Education  2018 [employer] Eating Plan for Pregnant Women While you are pregnant, your body will require additional nutrition to help support your growing baby. It is recommended that you consume:  150 additional calories each day during your first trimester.  300 additional calories each day during your second trimester.  300 additional calories each day during your third trimester.  Eating a healthy, well-balanced diet is very important for your health and for your baby's health. You also have a higher need for some vitamins and minerals, such as folic acid, calcium, iron, and vitamin D. What do I need to know about eating during pregnancy?  Do not try to lose weight   or go on a diet during pregnancy.  Choose healthy, nutritious foods. Choose  of a sandwich with a glass of milk instead of a candy bar or a high-calorie sugar-sweetened beverage.  Limit your overall intake of foods that have "empty calories." These are foods that have little nutritional value, such as sweets, desserts, candies, sugar-sweetened beverages, and fried foods.  Eat a  variety of foods, especially fruits and vegetables.  Take a prenatal vitamin to help meet the additional needs during pregnancy, specifically for folic acid, iron, calcium, and vitamin D.  Remember to stay active. Ask your health care provider for exercise recommendations that are specific to you.  Practice good food safety and cleanliness, such as washing your hands before you eat and after you prepare raw meat. This helps to prevent foodborne illnesses, such as listeriosis, that can be very dangerous for your baby. Ask your health care provider for more information about listeriosis. What does 150 extra calories look like? Healthy options for an additional 150 calories each day could be any of the following:  Plain low-fat yogurt (6-8 oz) with  cup of berries.  1 apple with 2 teaspoons of peanut butter.  Cut-up vegetables with  cup of hummus.  Low-fat chocolate milk (8 oz or 1 cup).  1 string cheese with 1 medium orange.   of a peanut butter and jelly sandwich on whole-wheat bread (1 tsp of peanut butter).  For 300 calories, you could eat two of those healthy options each day. What is a healthy amount of weight to gain? The recommended amount of weight for you to gain is based on your pre-pregnancy BMI. If your pre-pregnancy BMI was:  Less than 18 (underweight), you should gain 28-40 lb.  18-24.9 (normal), you should gain 25-35 lb.  25-29.9 (overweight), you should gain 15-25 lb.  Greater than 30 (obese), you should gain 11-20 lb.  What if I am having twins or multiples? Generally, pregnant women who will be having twins or multiples may need to increase their daily calories by 300-600 calories each day. The recommended range for total weight gain is 25-54 lb, depending on your pre-pregnancy BMI. Talk with your health care provider for specific guidance about additional nutritional needs, weight gain, and exercise during your pregnancy. What foods can I eat? Grains Any  grains. Try to choose whole grains, such as whole-wheat bread, oatmeal, or brown rice. Vegetables Any vegetables. Try to eat a variety of colors and types of vegetables to get a full range of vitamins and minerals. Remember to wash your vegetables well before eating. Fruits Any fruits. Try to eat a variety of colors and types of fruit to get a full range of vitamins and minerals. Remember to wash your fruits well before eating. Meats and Other Protein Sources Lean meats, including chicken, turkey, fish, and lean cuts of beef, veal, or pork. Make sure that all meats are cooked to "well done." Tofu. Tempeh. Beans. Eggs. Peanut butter and other nut butters. Seafood, such as shrimp, crab, and lobster. If you choose fish, select types that are higher in omega-3 fatty acids, including salmon, herring, mussels, trout, sardines, and pollock. Make sure that all meats are cooked to food-safe temperatures. Dairy Pasteurized milk and milk alternatives. Pasteurized yogurt and pasteurized cheese. Cottage cheese. Sour cream. Beverages Water. Juices that contain 100% fruit juice or vegetable juice. Caffeine-free teas and decaffeinated coffee. Drinks that contain caffeine are okay to drink, but it is better to avoid caffeine. Keep your total caffeine   intake to less than 200 mg each day (12 oz of coffee, tea, or soda) or as directed by your health care provider. Condiments Any pasteurized condiments. Sweets and Desserts Any sweets and desserts. Fats and Oils Any fats and oils. The items listed above may not be a complete list of recommended foods or beverages. Contact your dietitian for more options. What foods are not recommended? Vegetables Unpasteurized (raw) vegetable juices. Fruits Unpasteurized (raw) fruit juices. Meats and Other Protein Sources Cured meats that have nitrates, such as bacon, salami, and hotdogs. Luncheon meats, bologna, or other deli meats (unless they are reheated until they are  steaming hot). Refrigerated pate, meat spreads from a meat counter, smoked seafood that is found in the refrigerated section of a store. Raw fish, such as sushi or sashimi. High mercury content fish, such as tilefish, shark, swordfish, and king mackerel. Raw meats, such as tuna or beef tartare. Undercooked meats and poultry. Make sure that all meats are cooked to food-safe temperatures. Dairy Unpasteurized (raw) milk and any foods that have raw milk in them. Soft cheeses, such as feta, queso blanco, queso fresco, Brie, Camembert cheeses, blue-veined cheeses, and Panela cheese (unless it is made with pasteurized milk, which must be stated on the label). Beverages Alcohol. Sugar-sweetened beverages, such as sodas, teas, or energy drinks. Condiments Homemade fermented foods and drinks, such as pickles, sauerkraut, or kombucha drinks. (Store-bought pasteurized versions of these are okay.) Other Salads that are made in the store, such as ham salad, chicken salad, egg salad, tuna salad, and seafood salad. The items listed above may not be a complete list of foods and beverages to avoid. Contact your dietitian for more information. This information is not intended to replace advice given to you by your health care provider. Make sure you discuss any questions you have with your health care provider. Document Released: 03/06/2014 Document Revised: 10/28/2015 Document Reviewed: 11/04/2013 [employer] Interactive Patient Education  2018 [employer] Prenatal Care WHAT IS PRENATAL CARE? Prenatal care is the process of caring for a pregnant woman before she gives birth. Prenatal care makes sure that she and her baby remain as healthy as possible throughout pregnancy. Prenatal care may be provided by a midwife, family practice health care provider, or a childbirth and pregnancy specialist (obstetrician). Prenatal care may include physical examinations, testing, treatments, and education on nutrition, lifestyle, and  social support services. WHY IS PRENATAL CARE SO IMPORTANT? Early and consistent prenatal care increases the chance that you and your baby will remain healthy throughout your pregnancy. This type of care also decreases a baby's risk of being born too early (prematurely), or being born smaller than expected (small for gestational age). Any underlying medical conditions you may have that could pose a risk during your pregnancy are discussed during prenatal care visits. You will also be monitored regularly for any new conditions that may arise during your pregnancy so they can be treated quickly and effectively. WHAT HAPPENS DURING PRENATAL CARE VISITS? Prenatal care visits may include the following: Discussion Tell your health care provider about any new signs or symptoms you have experienced since your last visit. These might include:  Nausea or vomiting.  Increased or decreased level of energy.  Difficulty sleeping.  Back or leg pain.  Weight changes.  Frequent urination.  Shortness of breath with physical activity.  Changes in your skin, such as the development of a rash or itchiness.  Vaginal discharge or bleeding.  Feelings of excitement or nervousness.  Changes in   your baby's movements.  You may want to write down any questions or topics you want to discuss with your health care provider and bring them with you to your appointment. Examination During your first prenatal care visit, you will likely have a complete physical exam. Your health care provider will often examine your vagina, cervix, and the position of your uterus, as well as check your heart, lungs, and other body systems. As your pregnancy progresses, your health care provider will measure the size of your uterus and your baby's position inside your uterus. He or she may also examine you for early signs of labor. Your prenatal visits may also include checking your blood pressure and, after about 10-12 weeks of  pregnancy, listening to your baby's heartbeat. Testing Regular testing often includes:  Urinalysis. This checks your urine for glucose, protein, or signs of infection.  Blood count. This checks the levels of white and red blood cells in your body.  Tests for sexually transmitted infections (STIs). Testing for STIs at the beginning of pregnancy is routinely done and is required in many states.  Antibody testing. You will be checked to see if you are immune to certain illnesses, such as rubella, that can affect a developing fetus.  Glucose screen. Around 24-28 weeks of pregnancy, your blood glucose level will be checked for signs of gestational diabetes. Follow-up tests may be recommended.  Group B strep. This is a bacteria that is commonly found inside a woman's vagina. This test will inform your health care provider if you need an antibiotic to reduce the amount of this bacteria in your body prior to labor and childbirth.  Ultrasound. Many pregnant women undergo an ultrasound screening around 18-20 weeks of pregnancy to evaluate the health of the fetus and check for any developmental abnormalities.  HIV (human immunodeficiency virus) testing. Early in your pregnancy, you will be screened for HIV. If you are at high risk for HIV, this test may be repeated during your third trimester of pregnancy.  You may be offered other testing based on your age, personal or family medical history, or other factors. HOW OFTEN SHOULD I PLAN TO SEE MY HEALTH CARE PROVIDER FOR PRENATAL CARE? Your prenatal care check-up schedule depends on any medical conditions you have before, or develop during, your pregnancy. If you do not have any underlying medical conditions, you will likely be seen for checkups:  Monthly, during the first 6 months of pregnancy.  Twice a month during months 7 and 8 of pregnancy.  Weekly starting in the 9th month of pregnancy and until delivery.  If you develop signs of early labor  or other concerning signs or symptoms, you may need to see your health care provider more often. Ask your health care provider what prenatal care schedule is best for you. WHAT CAN I DO TO KEEP MYSELF AND MY BABY AS HEALTHY AS POSSIBLE DURING MY PREGNANCY?  Take a prenatal vitamin containing 400 micrograms (0.4 mg) of folic acid every day. Your health care provider may also ask you to take additional vitamins such as iodine, vitamin D, iron, copper, and zinc.  Take 1500-2000 mg of calcium daily starting at your 20th week of pregnancy until you deliver your baby.  Make sure you are up to date on your vaccinations. Unless directed otherwise by your health care provider: ? You should receive a tetanus, diphtheria, and pertussis (Tdap) vaccination between the 27th and 36th week of your pregnancy, regardless of when your last Tdap immunization   occurred. This helps protect your baby from whooping cough (pertussis) after he or she is born. ? You should receive an annual inactivated influenza vaccine (IIV) to help protect you and your baby from influenza. This can be done at any point during your pregnancy.  Eat a well-rounded diet that includes: ? Fresh fruits and vegetables. ? Lean proteins. ? Calcium-rich foods such as milk, yogurt, hard cheeses, and dark, leafy greens. ? Whole grain breads.  Do noteat seafood high in mercury, including: ? Swordfish. ? Tilefish. ? Shark. ? King mackerel. ? More than 6 oz tuna per week.  Do not eat: ? Raw or undercooked meats or eggs. ? Unpasteurized foods, such as soft cheeses (brie, blue, or feta), juices, and milks. ? Lunch meats. ? Hot dogs that have not been heated until they are steaming.  Drink enough water to keep your urine clear or pale yellow. For many women, this may be 10 or more 8 oz glasses of water each day. Keeping yourself hydrated helps deliver nutrients to your baby and may prevent the start of pre-term uterine contractions.  Do not use  any tobacco products including cigarettes, chewing tobacco, or electronic cigarettes. If you need help quitting, ask your health care provider.  Do not drink beverages containing alcohol. No safe level of alcohol consumption during pregnancy has been determined.  Do not use any illegal drugs. These can harm your developing baby or cause a miscarriage.  Ask your health care provider or pharmacist before taking any prescription or over-the-counter medicines, herbs, or supplements.  Limit your caffeine intake to no more than 200 mg per day.  Exercise. Unless told otherwise by your health care provider, try to get 30 minutes of moderate exercise most days of the week. Do not  do high-impact activities, contact sports, or activities with a high risk of falling, such as horseback riding or downhill skiing.  Get plenty of rest.  Avoid anything that raises your body temperature, such as hot tubs and saunas.  If you own a cat, do not empty its litter box. Bacteria contained in cat feces can cause an infection called toxoplasmosis. This can result in serious harm to the fetus.  Stay away from chemicals such as insecticides, lead, mercury, and cleaning or paint products that contain solvents.  Do not have any X-rays taken unless medically necessary.  Take a childbirth and breastfeeding preparation class. Ask your health care provider if you need a referral or recommendation.  This information is not intended to replace advice given to you by your health care provider. Make sure you discuss any questions you have with your health care provider. Document Released: 05/25/2003 Document Revised: 10/25/2015 Document Reviewed: 08/06/2013 [employer] Interactive Patient Education  2017 [employer]  
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
## 2018-02-02 LAB — URINE DRUG PANEL 7
Amphetamines, Urine: NEGATIVE ng/mL
Barbiturate Quant, Ur: NEGATIVE ng/mL
Benzodiazepine Quant, Ur: NEGATIVE ng/mL
CANNABINOID QUANT UR: NEGATIVE ng/mL
COCAINE (METAB.): NEGATIVE ng/mL
OPIATE QUANT UR: NEGATIVE ng/mL
PCP QUANT UR: NEGATIVE ng/mL

## 2018-02-19 ENCOUNTER — Other Ambulatory Visit: Payer: Self-pay

## 2018-02-19 ENCOUNTER — Inpatient Hospital Stay
Admission: EM | Admit: 2018-02-19 | Discharge: 2018-02-19 | Disposition: A | Discharge status: Home / Self Care | Payer: Medicaid Other | Attending: Obstetrics and Gynecology | Admitting: Obstetrics and Gynecology

## 2018-02-19 DIAGNOSIS — O09299 Supervision of pregnancy with other poor reproductive or obstetric history, unspecified trimester: Secondary | ICD-10-CM

## 2018-02-19 DIAGNOSIS — Z3A27 27 weeks gestation of pregnancy: Secondary | ICD-10-CM | POA: Diagnosis not present

## 2018-02-19 DIAGNOSIS — R1031 Right lower quadrant pain: Secondary | ICD-10-CM | POA: Diagnosis present

## 2018-02-19 DIAGNOSIS — O99342 Other mental disorders complicating pregnancy, second trimester: Secondary | ICD-10-CM | POA: Diagnosis not present

## 2018-02-19 DIAGNOSIS — O26892 Other specified pregnancy related conditions, second trimester: Secondary | ICD-10-CM | POA: Insufficient documentation

## 2018-02-19 DIAGNOSIS — F319 Bipolar disorder, unspecified: Secondary | ICD-10-CM | POA: Insufficient documentation

## 2018-02-19 HISTORY — DX: Bipolar disorder, unspecified: F31.9

## 2018-02-19 NOTE — Discharge Summary (Signed)
See Final Progress Note 

## 2018-02-19 NOTE — Final Progress Note (Signed)
Physician Final Progress Note  Patient ID: Martha [name] MRN: [id number] DOB/AGE: [birth year] 21 y.o.  Admit date: 02/19/2018 Admitting provider: Conard NovakStephen D Gerard Bonus, MD Discharge date: 02/19/2018   Admission Diagnoses:  1) intrauterine pregnancy at 5726w5d  2) abdominal pain in pregnancy, third trimester  Discharge Diagnoses:  1) intrauterine pregnancy at 4626w5d  2) abdominal pain in pregnancy, third trimester  History of Present Illness: The patient is a 22 y.o. female G2P0010 at 7726w5d who presents for a one week history of right lower quadrant abdominal pain. She notes a "lump" in her right inguinal area that occurs occasionally.  Sometimes the lump is present, others it is not.  She notes pain when bending certain ways and with certain types of exertion. She does not notice the lump during these times.  To make the pain go away she lies down. It is only while lying down that she note the lump.  The pain will start on her right side and shoot across her lower abdomen. She denies nausea, emesis, diarrhea, constipation, urinary symptoms (including hematuria). She denies vaginal symptoms of irritation, discharge, itching. She notes +FM, no LOF, no vaginal bleeding, no contractions.   Hospital Course: The patient was admitted to labor and delivery triage for observation.  She had a benign abdominal exam with no lump noted, nor tenderness on exam. The patient did not report the pain while in the hospital. The fetal tracing was normal, apart from a disconnected variable-type deceleration that occurred on two occasions. It was difficult to determine whether these were true decelerations. A bedside ultrasound was performed with the the single deepest pocket of about 5 cm.  There was good fetal movement noted throughout the ultrasound. Placenta is posterior and fundal. The fetus is cephalic in presentation. Her vital signs were normal. She was discharged with precautions of worsening abdominal pain,  bleeding.  The area of concern is along the inguinal ligament with no hernia noted.  Hernia precautions given, as well.    Past Medical History:  Diagnosis Date  . Bipolar 1 disorder [facility](H[facility])     Past Surgical History:  Procedure Laterality Date  . NO PAST SURGERIES      No current facility-administered medications on file prior to encounter.    Current Outpatient Medications on File Prior to Encounter  Medication Sig Dispense Refill  . metoCLOPramide (REGLAN) 10 MG tablet Take 1 tablet (10 mg total) by mouth every 8 (eight) hours as needed for up to 14 days for nausea or vomiting. 20 tablet 0  . ondansetron (ZOFRAN ODT) 4 MG disintegrating tablet Take 1 tablet (4 mg total) by mouth every 8 (eight) hours as needed for nausea or vomiting. (Patient not taking: Reported on 08/08/2017) 20 tablet 0  . Prenatal Vit-Fe Fumarate-FA (PRENATAL MULTIVITAMIN) TABS tablet Take 1 tablet by mouth daily at 12 noon.      No Known Allergies  Social History   Socioeconomic History  . Marital status: Single    Spouse name: Not on file  . Number of children: Not on file  . Years of education: Not on file  . Highest education level: Not on file  Occupational History  . Not on file  Social Needs  . Financial resource strain: Not on file  . Food insecurity:    Worry: Not on file    Inability: Not on file  . Transportation needs:    Medical: Not on file    Non-medical: Not on file  Tobacco Use  .  Smoking status: Former Smoker    Packs/day: 0.25    Types: Cigarettes  . Smokeless tobacco: Never Used  Substance and Sexual Activity  . Alcohol use: Not Currently  . Drug use: Not Currently    Types: Marijuana    Comment: 2-3 weeks ago  . Sexual activity: Yes  Lifestyle  . Physical activity:    Days per week: Not on file    Minutes per session: Not on file  . Stress: Not on file  Relationships  . Social connections:    Talks on phone: Not on file    Gets together: Not on file    Attends  religious service: Not on file    Active member of club or organization: Not on file    Attends meetings of clubs or organizations: Not on file    Relationship status: Not on file  . Intimate partner violence:    Fear of current or ex partner: Not on file    Emotionally abused: Not on file    Physically abused: Not on file    Forced sexual activity: Not on file  Other Topics Concern  . Not on file  Social History Narrative  . Not on file   Family History  Problem Relation Age of Onset  . COPD Maternal Grandmother   . Diabetes Maternal Grandfather   . Hypertension Maternal Grandfather     Review of Systems  Constitutional: Negative.  Negative for chills, diaphoresis, fever and weight loss.  HENT: Negative.   Eyes: Negative.   Respiratory: Negative.   Cardiovascular: Negative.   Gastrointestinal: Positive for abdominal pain (see HPI). Negative for blood in stool, constipation, diarrhea, heartburn, melena, nausea and vomiting.  Genitourinary: Negative.  Negative for dysuria, flank pain, frequency, hematuria and urgency.  Musculoskeletal: Negative.   Skin: Negative.   Neurological: Negative.   Psychiatric/Behavioral: Negative.      Physical Exam: BP (!) 109/56 (BP Location: Left Arm)   Pulse 86   Temp 98.2 F (36.8 C) (Oral)   Resp 20   Ht 5\' 4"  (1.626 m)   Wt 81.6 kg   LMP 08/09/2017   BMI 30.90 kg/m   Physical Exam  Constitutional: She is oriented to person, place, and time. She appears well-developed and well-nourished. She does not appear ill.  HENT:  Head: Normocephalic and atraumatic.  Eyes: Pupils are equal, round, and reactive to light. No scleral icterus.  Cardiovascular: Normal rate and regular rhythm.  No murmur heard. Pulmonary/Chest: Effort normal and breath sounds normal.  Abdominal: Soft. Normal appearance and bowel sounds are normal. There is no rebound and no CVA tenderness. No hernia. Hernia confirmed negative in the right inguinal area and confirmed  negative in the left inguinal area.  Neurological: She is alert and oriented to person, place, and time.  Skin: Skin is warm and dry. She is not diaphoretic. No erythema.  Psychiatric: She has a normal mood and affect. Her behavior is normal.     Consults: None  Significant Findings/ Diagnostic Studies: none  Procedures:  Bedside ultrasound as noted above. NST Baseline FHR: 125 beats/min Variability: moderate Accelerations: present Decelerations: question of variable deceleration x 2.  They were not connected to the regular tracing.   Tocometry: quiet  Interpretation:  INDICATIONS: abdominal pain RESULTS:  A NST procedure was performed with FHR monitoring and a normal baseline established, appropriate time of 20-40 minutes of evaluation, and accels >2 seen w 15x15 characteristics.  Results show a REACTIVE NST.  Discharge Condition: stable  Disposition: Discharge disposition: 01-Home or Self Care       Diet: Regular diet  Discharge Activity: Activity as tolerated   Allergies as of 02/19/2018   No Known Allergies     Medication List    STOP taking these medications   ondansetron 4 MG disintegrating tablet Commonly known as:  ZOFRAN-ODT     TAKE these medications   metoCLOPramide 10 MG tablet Commonly known as:  REGLAN Take 1 tablet (10 mg total) by mouth every 8 (eight) hours as needed for up to 14 days for nausea or vomiting.   prenatal multivitamin Tabs tablet Take 1 tablet by mouth daily at 12 noon.      Follow-up Information    [facility]. Go on 02/22/2018.   Specialty:  Obstetrics and Gynecology Why:  Keep previously scheduled appointment Contact information: [address] [place] [address] [phone]          Total time spent taking care of this patient: 45 minutes  Signed: Thomasene Mohair, MD  02/19/2018, 1:55 PM

## 2018-02-19 NOTE — OB Triage Note (Signed)
Patient presented to L&D with complaints of lower abdominal pain that starts of right lower quadrant and shoots across her whole lower abdomen. States she noticed a hard knot (about the size of a quarter) on her right lower abdomen about 4 days-week ago, the pain started a couple days ago. (pain level is 5-6 out of a 10) States she hasn't tried to take anything for the pain yet. Denies vaginal bleeding, leaking of fluid or decreased fetal movement. Denies urinary symptoms, vaginal tching or unusual vaginal discharge.

## 2018-02-22 ENCOUNTER — Ambulatory Visit (INDEPENDENT_AMBULATORY_CARE_PROVIDER_SITE_OTHER): Payer: Medicaid Other | Admitting: Obstetrics and Gynecology

## 2018-02-22 ENCOUNTER — Other Ambulatory Visit: Payer: Medicaid Other

## 2018-02-22 VITALS — BP 110/60 | Wt 182.0 lb

## 2018-02-22 DIAGNOSIS — Z131 Encounter for screening for diabetes mellitus: Secondary | ICD-10-CM

## 2018-02-22 DIAGNOSIS — O0970 Supervision of high risk pregnancy due to social problems, unspecified trimester: Secondary | ICD-10-CM

## 2018-02-22 DIAGNOSIS — O0973 Supervision of high risk pregnancy due to social problems, third trimester: Secondary | ICD-10-CM

## 2018-02-22 DIAGNOSIS — Z130 Encounter for screening for diseases of the blood and blood-forming organs and certain disorders involving the immune mechanism: Secondary | ICD-10-CM

## 2018-02-22 DIAGNOSIS — Z113 Encounter for screening for infections with a predominantly sexual mode of transmission: Secondary | ICD-10-CM

## 2018-02-22 DIAGNOSIS — Z3A28 28 weeks gestation of pregnancy: Secondary | ICD-10-CM

## 2018-02-22 LAB — POCT URINALYSIS DIPSTICK OB
GLUCOSE, UA: NEGATIVE
POC,PROTEIN,UA: NEGATIVE

## 2018-02-22 NOTE — Progress Notes (Signed)
ROB/GTT C/o "stomach feels bruised"  Declines flu shot

## 2018-02-22 NOTE — Progress Notes (Signed)
Routine Prenatal Care Visit  Subjective  Martha [name] is a 22 y.o. G2P0010 at [redacted]w[redacted]d being seen today for ongoing prenatal care.  She is currently monitored for the following issues for this high-risk pregnancy and has Family history of intellectual disabilities; First trimester screening; Suprvsn of high risk preg due to social problems, unsp tri; and History of spontaneous abortion, currently pregnant on their problem list.  ----------------------------------------------------------------------------------- Patient reports no complaints.   Contractions: Not present. Vag. Bleeding: None.  Movement: Present. Denies leaking of fluid.  ----------------------------------------------------------------------------------- The following portions of the patient's history were reviewed and updated as appropriate: allergies, current medications, past family history, past medical history, past social history, past surgical history and problem list. Problem list updated.   Objective  Blood pressure 110/60, weight 182 lb (82.6 kg), last menstrual period 08/09/2017, unknown if currently breastfeeding. Pregravid weight 155 lb (70.3 kg) Total Weight Gain 27 lb (12.2 kg) Urinalysis:      Fetal Status: Fetal Heart Rate (bpm): 120 Fundal Height: 39 cm Movement: Present     General:  Alert, oriented and cooperative. Patient is in no acute distress.  Skin: Skin is warm and dry. No rash noted.   Cardiovascular: Normal heart rate noted  Respiratory: Normal respiratory effort, no problems with respiration noted  Abdomen: Soft, gravid, appropriate for gestational age. Pain/Pressure: Absent     Pelvic:  Cervical exam deferred        Extremities: Normal range of motion.     ental Status: Normal mood and affect. Normal behavior. Normal judgment and thought content.     Assessment   22 y.o. G2P0010 at [redacted]w[redacted]d by  05/16/2018, by Last Menstrual Period presenting for routine prenatal visit  Plan    pregnancy 2 Problems (from 02/01/18 to present)    Problem Noted Resolved   Suprvsn of high risk preg due to social problems, unsp tri 02/01/2018 by Tresea Mall, CNM No   Overview Addendum 02/22/2018 11:08 AM by Natale Milch, MD    Clinic [facility] Prenatal Labs  Dating  LMP =12 wk [place] Blood type: --/--/O POS  (02/26 1504)   Genetic Screen 1 Screen:    AFP:     Quad:     NIPS: Antibody:   Anatomic [place] Complete, normal Rubella:   Varicella: @VZVIGG @  GTT Early:               Third trimester:  RPR:     Rhogam Not needed HBsAg:     TDaP vaccine                        Flu Shot: Declines HIV:     Baby Food    Breast/bottle                     GBS:   Contraception Undecided, considering pill Pap:  CBB     CS/VBAC NA   Support Person Husband [name]          History of spontaneous abortion, currently pregnant 02/01/2018 by Tresea Mall, CNM No       Gestational age appropriate obstetric precautions including but not limited to vaginal bleeding, contractions, leaking of fluid and fetal movement were reviewed in detail with the patient.    Given information on prenatal classes with [facility].  Given information on birthcontrol options postpartum. Recommended [email or web address].  Given information on volunteer doula program at the hospital.  Discussed breast feeding. Patient is planning  on breast and bottle feeding. Patient was given resources and lactation consultation was advised.    Return in about 2 weeks (around 03/08/2018) for ROB.  Adelene Idlerhristanna Schuman MD [facility], [facility] 02/22/18 11:08 AM

## 2018-02-23 ENCOUNTER — Observation Stay
Admission: EM | Admit: 2018-02-23 | Discharge: 2018-02-24 | Disposition: A | Discharge status: Home / Self Care | Payer: Medicaid Other | Attending: Obstetrics & Gynecology | Admitting: Obstetrics & Gynecology

## 2018-02-23 DIAGNOSIS — Z3490 Encounter for supervision of normal pregnancy, unspecified, unspecified trimester: Secondary | ICD-10-CM

## 2018-02-23 DIAGNOSIS — Z3A28 28 weeks gestation of pregnancy: Secondary | ICD-10-CM | POA: Insufficient documentation

## 2018-02-23 DIAGNOSIS — O26893 Other specified pregnancy related conditions, third trimester: Principal | ICD-10-CM | POA: Insufficient documentation

## 2018-02-23 LAB — 28 WEEK RH+PANEL
Basophils Absolute: 0 10*3/uL (ref 0.0–0.2)
Basos: 0 %
EOS (ABSOLUTE): 0.1 10*3/uL (ref 0.0–0.4)
EOS: 0 %
Gestational Diabetes Screen: 89 mg/dL (ref 65–139)
HEMATOCRIT: 32.6 % — AB (ref 34.0–46.6)
HEMOGLOBIN: 11 g/dL — AB (ref 11.1–15.9)
HIV Screen 4th Generation wRfx: NONREACTIVE
Immature Grans (Abs): 0.1 10*3/uL (ref 0.0–0.1)
Immature Granulocytes: 1 %
LYMPHS ABS: 2 10*3/uL (ref 0.7–3.1)
Lymphs: 17 %
MCH: 29.1 pg (ref 26.6–33.0)
MCHC: 33.7 g/dL (ref 31.5–35.7)
MCV: 86 fL (ref 79–97)
Monocytes Absolute: 0.7 10*3/uL (ref 0.1–0.9)
Monocytes: 6 %
NEUTROS ABS: 8.6 10*3/uL — AB (ref 1.4–7.0)
Neutrophils: 76 %
Platelets: 246 10*3/uL (ref 150–450)
RBC: 3.78 x10E6/uL (ref 3.77–5.28)
RDW: 12.1 % — AB (ref 12.3–15.4)
RPR Ser Ql: NONREACTIVE
WBC: 11.5 10*3/uL — AB (ref 3.4–10.8)

## 2018-02-24 DIAGNOSIS — Z3490 Encounter for supervision of normal pregnancy, unspecified, unspecified trimester: Secondary | ICD-10-CM

## 2018-02-24 DIAGNOSIS — O26893 Other specified pregnancy related conditions, third trimester: Secondary | ICD-10-CM | POA: Diagnosis not present

## 2018-02-24 DIAGNOSIS — Z3A28 28 weeks gestation of pregnancy: Secondary | ICD-10-CM | POA: Diagnosis not present

## 2018-02-24 NOTE — Final Progress Note (Signed)
Physician Final Progress Note  Patient ID: Martha [name] MRN: [id number] DOB/AGE: 08-12-1995 22 y.o.  Admit date: 02/23/2018 Admitting provider: Nadara Mustardobert P Ismelda Weatherman, MD Discharge date: 02/24/2018  Admission Diagnoses: Abdominal pain in pregnancy, 28 weeks pregnancy, vaginal discharge  Discharge Diagnoses:  Active Problems:   Pregnancy   Same  Consults: None  Significant Findings/ Diagnostic Studies: Patient presented for evaluation of labor.  Patient had cervical exam by RN and this was reported to me. I reviewed her vital signs and fetal tracing, both of which were reassuring.  Patient was discharge as she was not laboring. No s/sx PROM either.  Procedures: A NST procedure was performed with FHR monitoring and a normal baseline established, appropriate time of 20-40 minutes of evaluation, and accels >2 seen w 10x10 characteristics.  Results show a REACTIVE NST.   Discharge Condition: good  Disposition:  Home  Diet: Regular diet  Discharge Activity: Activity as tolerated   Allergies as of 02/24/2018   No Known Allergies     Medication List    ASK your doctor about these medications   metoCLOPramide 10 MG tablet Commonly known as:  REGLAN Take 1 tablet (10 mg total) by mouth every 8 (eight) hours as needed for up to 14 days for nausea or vomiting.   prenatal multivitamin Tabs tablet Take 1 tablet by mouth daily at 12 noon.      Total time spent taking care of this patient: TRIAGE  Signed: Letitia Libraobert Paul Isidor Bromell 02/24/2018, 11:21 AM

## 2018-02-24 NOTE — Discharge Summary (Signed)
  See FPN 

## 2018-03-08 ENCOUNTER — Ambulatory Visit (INDEPENDENT_AMBULATORY_CARE_PROVIDER_SITE_OTHER): Payer: Medicaid Other | Admitting: Advanced Practice Midwife

## 2018-03-08 ENCOUNTER — Encounter: Payer: Self-pay | Admitting: Advanced Practice Midwife

## 2018-03-08 VITALS — BP 114/70 | Wt 185.0 lb

## 2018-03-08 DIAGNOSIS — Z3A30 30 weeks gestation of pregnancy: Secondary | ICD-10-CM

## 2018-03-08 NOTE — Progress Notes (Signed)
Routine Prenatal Care Visit  Subjective  Martha [name] is a 22 y.o. G2P0010 at [redacted]w[redacted]d being seen today for ongoing prenatal care.  She is currently monitored for the following issues for this high-risk pregnancy and has Family history of intellectual disabilities; First trimester screening; Suprvsn of high risk preg due to social problems, unsp tri; History of spontaneous abortion, currently pregnant; and Pregnancy on their problem list.  ----------------------------------------------------------------------------------- Patient reports feeling tired. She has what she describes as tearing pain in her abdomen especially at night. She denies contractions except for braxton hicks. Recommended unisom or tylenol pm for help with sleep and abdominal support band for abdominal pain.   Contractions: Not present. Vag. Bleeding: None.  Movement: Present. Denies leaking of fluid.  ----------------------------------------------------------------------------------- The following portions of the patient's history were reviewed and updated as appropriate: allergies, current medications, past family history, past medical history, past social history, past surgical history and problem list. Problem list updated.   Objective  Blood pressure 114/70, weight 185 lb (83.9 kg), last menstrual period 08/09/2017, unknown if currently breastfeeding. Pregravid weight 155 lb (70.3 kg) Total Weight Gain 30 lb (13.6 kg) Urinalysis: Urine Protein    Urine Glucose    Fetal Status: Fetal Heart Rate (bpm): 125 Fundal Height: 31 cm Movement: Present     General:  Alert, oriented and cooperative. Patient is in no acute distress.  Skin: Skin is warm and dry. No rash noted.   Cardiovascular: Normal heart rate noted  Respiratory: Normal respiratory effort, no problems with respiration noted  Abdomen: Soft, gravid, appropriate for gestational age. Pain/Pressure: Absent     Pelvic:  Cervical exam deferred        Extremities:  Normal range of motion.     Mental Status: Normal mood and affect. Normal behavior. Normal judgment and thought content.   Assessment   22 y.o. G2P0010 at [redacted]w[redacted]d by  05/16/2018, by Last Menstrual Period presenting for routine prenatal visit  Plan   pregnancy 2 Problems (from 02/01/18 to present)    Problem Noted Resolved   Suprvsn of high risk preg due to social problems, unsp tri 02/01/2018 by Tresea Mall, CNM No   Overview Addendum 02/22/2018 11:08 AM by Natale Milch, MD    Clinic [facility] Prenatal Labs  Dating  LMP =12 wk [place] Blood type: --/--/O POS  (02/26 1504)   Genetic Screen 1 Screen:    AFP:     Quad:     NIPS: Antibody:   Anatomic [place] Complete, normal Rubella:   Varicella: @VZVIGG @  GTT Early:               Third trimester:  RPR:     Rhogam Not needed HBsAg:     TDaP vaccine                        Flu Shot: Declines HIV:     Baby Food    Breast/bottle                     GBS:   Contraception Undecided, considering pill Pap:  CBB     CS/VBAC NA   Support Person Husband [name]          History of spontaneous abortion, currently pregnant 02/01/2018 by Tresea Mall, CNM No       Preterm labor symptoms and general obstetric precautions including but not limited to vaginal bleeding, contractions, leaking of fluid and fetal movement were reviewed  in detail with the patient. Please refer to After Visit Summary for other counseling recommendations.   Return in about 2 weeks (around 03/22/2018) for rob.  Tresea Mall, CNM 03/08/2018 10:33 AM

## 2018-03-08 NOTE — Progress Notes (Signed)
No v. No lof. TDAP at nv per pt

## 2018-03-08 NOTE — Patient Instructions (Signed)
Third Trimester of Pregnancy The third trimester is from week 28 through week 40 (months 7 through 9). The third trimester is a time when the unborn baby (fetus) is growing rapidly. At the end of the ninth month, the fetus is about 20 inches in length and weighs 6-10 pounds. Body changes during your third trimester Your body will continue to go through many changes during pregnancy. The changes vary from woman to woman. During the third trimester:  Your weight will continue to increase. You can expect to gain 25-35 pounds (11-16 kg) by the end of the pregnancy.  You may begin to get stretch marks on your hips, abdomen, and breasts.  You may urinate more often because the fetus is moving lower into your pelvis and pressing on your bladder.  You may develop or continue to have heartburn. This is caused by increased hormones that slow down muscles in the digestive tract.  You may develop or continue to have constipation because increased hormones slow digestion and cause the muscles that push waste through your intestines to relax.  You may develop hemorrhoids. These are swollen veins (varicose veins) in the rectum that can itch or be painful.  You may develop swollen, bulging veins (varicose veins) in your legs.  You may have increased body aches in the pelvis, back, or thighs. This is due to weight gain and increased hormones that are relaxing your joints.  You may have changes in your hair. These can include thickening of your hair, rapid growth, and changes in texture. Some women also have hair loss during or after pregnancy, or hair that feels dry or thin. Your hair will most likely return to normal after your baby is born.  Your breasts will continue to grow and they will continue to become tender. A yellow fluid (colostrum) may leak from your breasts. This is the first milk you are producing for your baby.  Your belly button may stick out.  You may notice more swelling in your hands,  face, or ankles.  You may have increased tingling or numbness in your hands, arms, and legs. The skin on your belly may also feel numb.  You may feel short of breath because of your expanding uterus.  You may have more problems sleeping. This can be caused by the size of your belly, increased need to urinate, and an increase in your body's metabolism.  You may notice the fetus "dropping," or moving lower in your abdomen (lightening).  You may have increased vaginal discharge.  You may notice your joints feel loose and you may have pain around your pelvic bone.  What to expect at prenatal visits You will have prenatal exams every 2 weeks until week 36. Then you will have weekly prenatal exams. During a routine prenatal visit:  You will be weighed to make sure you and the baby are growing normally.  Your blood pressure will be taken.  Your abdomen will be measured to track your baby's growth.  The fetal heartbeat will be listened to.  Any test results from the previous visit will be discussed.  You may have a cervical check near your due date to see if your cervix has softened or thinned (effaced).  You will be tested for Group B streptococcus. This happens between 35 and 37 weeks.  Your health care provider may ask you:  What your birth plan is.  How you are feeling.  If you are feeling the baby move.  If you have had   any abnormal symptoms, such as leaking fluid, bleeding, severe headaches, or abdominal cramping.  If you are using any tobacco products, including cigarettes, chewing tobacco, and electronic cigarettes.  If you have any questions.  Other tests or screenings that may be performed during your third trimester include:  Blood tests that check for low iron levels (anemia).  Fetal testing to check the health, activity level, and growth of the fetus. Testing is done if you have certain medical conditions or if there are problems during the  pregnancy.  Nonstress test (NST). This test checks the health of your baby to make sure there are no signs of problems, such as the baby not getting enough oxygen. During this test, a belt is placed around your belly. The baby is made to move, and its heart rate is monitored during movement.  What is false labor? False labor is a condition in which you feel small, irregular tightenings of the muscles in the womb (contractions) that usually go away with rest, changing position, or drinking water. These are called Braxton Hicks contractions. Contractions may last for hours, days, or even weeks before true labor sets in. If contractions come at regular intervals, become more frequent, increase in intensity, or become painful, you should see your health care provider. What are the signs of labor?  Abdominal cramps.  Regular contractions that start at 10 minutes apart and become stronger and more frequent with time.  Contractions that start on the top of the uterus and spread down to the lower abdomen and back.  Increased pelvic pressure and dull back pain.  A watery or bloody mucus discharge that comes from the vagina.  Leaking of amniotic fluid. This is also known as your "water breaking." It could be a slow trickle or a gush. Let your health care provider know if it has a color or strange odor. If you have any of these signs, call your health care provider right away, even if it is before your due date. Follow these instructions at home: Medicines  Follow your health care provider's instructions regarding medicine use. Specific medicines may be either safe or unsafe to take during pregnancy.  Take a prenatal vitamin that contains at least 600 micrograms (mcg) of folic acid.  If you develop constipation, try taking a stool softener if your health care provider approves. Eating and drinking  Eat a balanced diet that includes fresh fruits and vegetables, whole grains, good sources of protein  such as meat, eggs, or tofu, and low-fat dairy. Your health care provider will help you determine the amount of weight gain that is right for you.  Avoid raw meat and uncooked cheese. These carry germs that can cause birth defects in the baby.  If you have low calcium intake from food, talk to your health care provider about whether you should take a daily calcium supplement.  Eat four or five small meals rather than three large meals a day.  Limit foods that are high in fat and processed sugars, such as fried and sweet foods.  To prevent constipation: ? Drink enough fluid to keep your urine clear or pale yellow. ? Eat foods that are high in fiber, such as fresh fruits and vegetables, whole grains, and beans. Activity  Exercise only as directed by your health care provider. Most women can continue their usual exercise routine during pregnancy. Try to exercise for 30 minutes at least 5 days a week. Stop exercising if you experience uterine contractions.  Avoid heavy   lifting.  Do not exercise in extreme heat or humidity, or at high altitudes.  Wear low-heel, comfortable shoes.  Practice good posture.  You may continue to have sex unless your health care provider tells you otherwise. Relieving pain and discomfort  Take frequent breaks and rest with your legs elevated if you have leg cramps or low back pain.  Take warm sitz baths to soothe any pain or discomfort caused by hemorrhoids. Use hemorrhoid cream if your health care provider approves.  Wear a good support bra to prevent discomfort from breast tenderness.  If you develop varicose veins: ? Wear support pantyhose or compression stockings as told by your healthcare provider. ? Elevate your feet for 15 minutes, 3-4 times a day. Prenatal care  Write down your questions. Take them to your prenatal visits.  Keep all your prenatal visits as told by your health care provider. This is important. Safety  Wear your seat belt at  all times when driving.  Make a list of emergency phone numbers, including numbers for family, friends, the hospital, and police and fire departments. General instructions  Avoid cat litter boxes and soil used by cats. These carry germs that can cause birth defects in the baby. If you have a cat, ask someone to clean the litter box for you.  Do not travel far distances unless it is absolutely necessary and only with the approval of your health care provider.  Do not use hot tubs, steam rooms, or saunas.  Do not drink alcohol.  Do not use any products that contain nicotine or tobacco, such as cigarettes and e-cigarettes. If you need help quitting, ask your health care provider.  Do not use any medicinal herbs or unprescribed drugs. These chemicals affect the formation and growth of the baby.  Do not douche or use tampons or scented sanitary pads.  Do not cross your legs for long periods of time.  To prepare for the arrival of your baby: ? Take prenatal classes to understand, practice, and ask questions about labor and delivery. ? Make a trial run to the hospital. ? Visit the hospital and tour the maternity area. ? Arrange for maternity or paternity leave through employers. ? Arrange for family and friends to take care of pets while you are in the hospital. ? Purchase a rear-facing car seat and make sure you know how to install it in your car. ? Pack your hospital bag. ? Prepare the baby's nursery. Make sure to remove all pillows and stuffed animals from the baby's crib to prevent suffocation.  Visit your dentist if you have not gone during your pregnancy. Use a soft toothbrush to brush your teeth and be gentle when you floss. Contact a health care provider if:  You are unsure if you are in labor or if your water has broken.  You become dizzy.  You have mild pelvic cramps, pelvic pressure, or nagging pain in your abdominal area.  You have lower back pain.  You have persistent  nausea, vomiting, or diarrhea.  You have an unusual or bad smelling vaginal discharge.  You have pain when you urinate. Get help right away if:  Your water breaks before 37 weeks.  You have regular contractions less than 5 minutes apart before 37 weeks.  You have a fever.  You are leaking fluid from your vagina.  You have spotting or bleeding from your vagina.  You have severe abdominal pain or cramping.  You have rapid weight loss or weight gain.    You have shortness of breath with chest pain.  You notice sudden or extreme swelling of your face, hands, ankles, feet, or legs.  Your baby makes fewer than 10 movements in 2 hours.  You have severe headaches that do not go away when you take medicine.  You have vision changes. Summary  The third trimester is from week 28 through week 40, months 7 through 9. The third trimester is a time when the unborn baby (fetus) is growing rapidly.  During the third trimester, your discomfort may increase as you and your baby continue to gain weight. You may have abdominal, leg, and back pain, sleeping problems, and an increased need to urinate.  During the third trimester your breasts will keep growing and they will continue to become tender. A yellow fluid (colostrum) may leak from your breasts. This is the first milk you are producing for your baby.  False labor is a condition in which you feel small, irregular tightenings of the muscles in the womb (contractions) that eventually go away. These are called Braxton Hicks contractions. Contractions may last for hours, days, or even weeks before true labor sets in.  Signs of labor can include: abdominal cramps; regular contractions that start at 10 minutes apart and become stronger and more frequent with time; watery or bloody mucus discharge that comes from the vagina; increased pelvic pressure and dull back pain; and leaking of amniotic fluid. This information is not intended to replace advice  given to you by your health care provider. Make sure you discuss any questions you have with your health care provider. Document Released: 05/16/2001 Document Revised: 10/28/2015 Document Reviewed: 07/23/2012 Elsevier Interactive Patient Education  2017 [employer]  

## 2018-03-22 ENCOUNTER — Ambulatory Visit (INDEPENDENT_AMBULATORY_CARE_PROVIDER_SITE_OTHER): Payer: Medicaid Other | Admitting: Certified Nurse Midwife

## 2018-03-22 VITALS — BP 110/60 | Wt 184.0 lb

## 2018-03-22 DIAGNOSIS — F319 Bipolar disorder, unspecified: Secondary | ICD-10-CM | POA: Insufficient documentation

## 2018-03-22 DIAGNOSIS — Z62810 Personal history of physical and sexual abuse in childhood: Secondary | ICD-10-CM | POA: Insufficient documentation

## 2018-03-22 DIAGNOSIS — Z3A32 32 weeks gestation of pregnancy: Secondary | ICD-10-CM

## 2018-03-22 DIAGNOSIS — Z23 Encounter for immunization: Secondary | ICD-10-CM

## 2018-03-22 DIAGNOSIS — F4323 Adjustment disorder with mixed anxiety and depressed mood: Secondary | ICD-10-CM | POA: Insufficient documentation

## 2018-03-22 DIAGNOSIS — Z87898 Personal history of other specified conditions: Secondary | ICD-10-CM | POA: Insufficient documentation

## 2018-03-22 DIAGNOSIS — O0970 Supervision of high risk pregnancy due to social problems, unspecified trimester: Secondary | ICD-10-CM

## 2018-03-22 DIAGNOSIS — O99323 Drug use complicating pregnancy, third trimester: Secondary | ICD-10-CM

## 2018-03-22 LAB — POCT URINALYSIS DIPSTICK OB
Glucose, UA: NEGATIVE
POC,PROTEIN,UA: NEGATIVE

## 2018-03-22 MED ORDER — TETANUS-DIPHTH-ACELL PERTUSSIS 5-2.5-18.5 LF-MCG/0.5 IM SUSP
0.5000 mL | Freq: Once | INTRAMUSCULAR | Status: AC
  Administered 2018-03-22: 0.5 mL via INTRAMUSCULAR

## 2018-03-22 NOTE — Progress Notes (Signed)
C/o [email or web address]

## 2018-03-22 NOTE — Progress Notes (Signed)
HROB at 32 wk 1 day: Baby active. Desires to breast feed. Given prenatal class schedule. Stopped smoking cigarettes in early pregnancy Hx of cocaine and MJ use prior to pregnancy with +MJ UDS at NOB FH 32 and FHT WNL Discussed pain relief measures in labor Given pamphlet on doula program TDAP and flu vaccine given today UDS ROB in 2 weeks. Farrel Conners, CNM

## 2018-03-23 LAB — URINE DRUG PANEL 7
AMPHETAMINES, URINE: NEGATIVE ng/mL
Barbiturate Quant, Ur: NEGATIVE ng/mL
Benzodiazepine Quant, Ur: NEGATIVE ng/mL
CANNABINOID QUANT UR: NEGATIVE ng/mL
COCAINE (METAB.): NEGATIVE ng/mL
Opiate Quant, Ur: NEGATIVE ng/mL
PCP Quant, Ur: NEGATIVE ng/mL

## 2018-03-24 ENCOUNTER — Encounter: Payer: Self-pay | Admitting: Certified Nurse Midwife

## 2018-03-29 ENCOUNTER — Encounter: Payer: Self-pay | Admitting: *Deleted

## 2018-03-29 ENCOUNTER — Other Ambulatory Visit: Payer: Self-pay

## 2018-03-29 ENCOUNTER — Observation Stay
Admission: EM | Admit: 2018-03-29 | Discharge: 2018-03-29 | Disposition: A | Discharge status: Home / Self Care | Payer: Medicaid Other | Attending: Obstetrics and Gynecology | Admitting: Obstetrics and Gynecology

## 2018-03-29 DIAGNOSIS — Z79899 Other long term (current) drug therapy: Secondary | ICD-10-CM | POA: Diagnosis not present

## 2018-03-29 DIAGNOSIS — O09299 Supervision of pregnancy with other poor reproductive or obstetric history, unspecified trimester: Secondary | ICD-10-CM

## 2018-03-29 DIAGNOSIS — Z3483 Encounter for supervision of other normal pregnancy, third trimester: Secondary | ICD-10-CM | POA: Diagnosis not present

## 2018-03-29 DIAGNOSIS — Z87891 Personal history of nicotine dependence: Secondary | ICD-10-CM | POA: Insufficient documentation

## 2018-03-29 DIAGNOSIS — O0970 Supervision of high risk pregnancy due to social problems, unspecified trimester: Secondary | ICD-10-CM

## 2018-03-29 NOTE — Discharge Summary (Signed)
Physician Final Progress Note  Patient ID: Martha [name] MRN: [id number] DOB/AGE: 10-20-1995 22 y.o.  Admit date: 03/29/2018 Admitting provider: Natale Milch, MD Discharge date: 03/29/2018   Admission Diagnoses: lost mucous plug  Discharge Diagnoses:  Active Problems:   Indication for care in labor and delivery, antepartum Reactive NST, no s/s labor  History of Present Illness: The patient is a 22 y.o. female G2P0010 at [redacted]w[redacted]d who presents for mucous discharge that she experienced this morning. She admits good fetal movement. She denies contractions. She denies any other leakage of fluid or vaginal bleeding. Precautions reviewed with patient.   Past Medical History:  Diagnosis Date  . Adjustment disorder with mixed anxiety and depressed mood   . Bipolar 1 disorder (HCC)   . Former smoker    stopped smoking 08/2017; did smoke 3/4 PPD  . History of substance use    cocaine and MJ    Past Surgical History:  Procedure Laterality Date  . NO PAST SURGERIES      No current facility-administered medications on file prior to encounter.    Current Outpatient Medications on File Prior to Encounter  Medication Sig Dispense Refill  . calcium carbonate (TUMS EX) 750 MG chewable tablet Chew 1 tablet by mouth as needed for heartburn.    . Prenatal Vit-Fe Fumarate-FA (PRENATAL MULTIVITAMIN) TABS tablet Take 1 tablet by mouth daily at 12 noon.      No Known Allergies  Social History   Socioeconomic History  . Marital status: Significant Other    Spouse name: Jonny Ruiz is FOB  . Number of children: 0  . Years of education: 58  . Highest education level: Not on file  Occupational History  . Occupation: homemaker  Social Needs  . Financial resource strain: Not on file  . Food insecurity:    Worry: Not on file    Inability: Not on file  . Transportation needs:    Medical: Not on file    Non-medical: Not on file  Tobacco Use  . Smoking status: Former Smoker    Packs/day:  0.25    Types: Cigarettes  . Smokeless tobacco: Never Used  Substance and Sexual Activity  . Alcohol use: Not Currently  . Drug use: Not Currently    Types: Marijuana    Comment: 2-3 weeks ago  . Sexual activity: Yes  Lifestyle  . Physical activity:    Days per week: Not on file    Minutes per session: Not on file  . Stress: Not on file  Relationships  . Social connections:    Talks on phone: Not on file    Gets together: Not on file    Attends religious service: Not on file    Active member of club or organization: Not on file    Attends meetings of clubs or organizations: Not on file    Relationship status: Not on file  . Intimate partner violence:    Fear of current or ex partner: Not on file    Emotionally abused: Not on file    Physically abused: Not on file    Forced sexual activity: Not on file  Other Topics Concern  . Not on file  Social History Narrative  . Not on file    Physical Exam: BP 109/63 (BP Location: Left Arm)   Pulse 92   Temp 98.5 F (36.9 C) (Oral)   Resp 16   Ht 5\' 4"  (1.626 m)   Wt 83 kg   LMP 08/09/2017  BMI 31.41 kg/m   Gen: NAD CV: RRR Pulm: CTAB Pelvic: deferred Toco: negative for contractions Fetal Well Being: 120 bpm, moderate variability, +accelerations, -decelerations Ext: no edema, no evidence of DVT  Consults: None  Significant Findings/ Diagnostic Studies: none  Procedures: NST  Discharge Condition: good  Disposition: Discharge disposition: 01-Home or Self Care       Diet: Regular diet  Discharge Activity: Activity as tolerated  Discharge Instructions    Discharge activity:  No Restrictions   Complete by:  As directed    Discharge diet:  No restrictions   Complete by:  As directed    Fetal Kick Count:  Lie on our left side for one hour after a meal, and count the number of times your baby kicks.  If it is less than 5 times, get up, move around and drink some juice.  Repeat the test 30 minutes later.  If it  is still less than 5 kicks in an hour, notify your doctor.   Complete by:  As directed    No sexual activity restrictions   Complete by:  As directed    Notify physician for a general feeling that "something is not right"   Complete by:  As directed    Notify physician for increase or change in vaginal discharge   Complete by:  As directed    Notify physician for intestinal cramps, with or without diarrhea, sometimes described as "gas pain"   Complete by:  As directed    Notify physician for leaking of fluid   Complete by:  As directed    Notify physician for low, dull backache, unrelieved by heat or Tylenol   Complete by:  As directed    Notify physician for menstrual like cramps   Complete by:  As directed    Notify physician for pelvic pressure   Complete by:  As directed    Notify physician for uterine contractions.  These may be painless and feel like the uterus is tightening or the baby is  "balling up"   Complete by:  As directed    Notify physician for vaginal bleeding   Complete by:  As directed    PRETERM LABOR:  Includes any of the follwing symptoms that occur between 20 - [redacted] weeks gestation.  If these symptoms are not stopped, preterm labor can result in preterm delivery, placing your baby at risk   Complete by:  As directed      Allergies as of 03/29/2018   No Known Allergies     Medication List    TAKE these medications   calcium carbonate 750 MG chewable tablet Commonly known as:  TUMS EX Chew 1 tablet by mouth as needed for heartburn.   prenatal multivitamin Tabs tablet Take 1 tablet by mouth daily at 12 noon.      Follow-up Information    [facility]. Go to.   Specialty:  Obstetrics and Gynecology Why:  regular scheduled prenatal appointment Contact information: [address] [place] [address] [phone]          Total time spent taking care of this patient: 15 minutes  Signed: Tresea Mall, CNM   03/29/2018, 3:04 PM

## 2018-03-29 NOTE — OB Triage Note (Signed)
Thinks she lost her mucous plug this morning around 10. "Clear jelly" in color. Reports mild odor. Denies vaginal bleeding or gush of fluid. Reports good fetal movement. Martha [name]

## 2018-04-05 ENCOUNTER — Ambulatory Visit (INDEPENDENT_AMBULATORY_CARE_PROVIDER_SITE_OTHER): Payer: Medicaid Other | Admitting: Obstetrics & Gynecology

## 2018-04-05 VITALS — BP 100/60 | Wt 190.0 lb

## 2018-04-05 DIAGNOSIS — O99323 Drug use complicating pregnancy, third trimester: Secondary | ICD-10-CM

## 2018-04-05 DIAGNOSIS — Z3A34 34 weeks gestation of pregnancy: Secondary | ICD-10-CM

## 2018-04-05 DIAGNOSIS — O0970 Supervision of high risk pregnancy due to social problems, unspecified trimester: Secondary | ICD-10-CM

## 2018-04-05 LAB — POCT URINALYSIS DIPSTICK OB
Glucose, UA: NEGATIVE
POC,PROTEIN,UA: NEGATIVE

## 2018-04-05 NOTE — Patient Instructions (Signed)
Braxton Hicks Contractions °Contractions of the uterus can occur throughout pregnancy, but they are not always a sign that you are in labor. You may have practice contractions called Braxton Hicks contractions. These false labor contractions are sometimes confused with true labor. °What are Braxton Hicks contractions? °Braxton Hicks contractions are tightening movements that occur in the muscles of the uterus before labor. Unlike true labor contractions, these contractions do not result in opening (dilation) and thinning of the cervix. Toward the end of pregnancy (32-34 weeks), Braxton Hicks contractions can happen more often and may become stronger. These contractions are sometimes difficult to tell apart from true labor because they can be very uncomfortable. You should not feel embarrassed if you go to the hospital with false labor. °Sometimes, the only way to tell if you are in true labor is for your health care provider to look for changes in the cervix. The health care provider will do a physical exam and may monitor your contractions. If you are not in true labor, the exam should show that your cervix is not dilating and your water has not broken. °If there are other health problems associated with your pregnancy, it is completely safe for you to be sent home with false labor. You may continue to have Braxton Hicks contractions until you go into true labor. °How to tell the difference between true labor and false labor °True labor °· Contractions last 30-70 seconds. °· Contractions become very regular. °· Discomfort is usually felt in the top of the uterus, and it spreads to the lower abdomen and low back. °· Contractions do not go away with walking. °· Contractions usually become more intense and increase in frequency. °· The cervix dilates and gets thinner. °False labor °· Contractions are usually shorter and not as strong as true labor contractions. °· Contractions are usually irregular. °· Contractions  are often felt in the front of the lower abdomen and in the groin. °· Contractions may go away when you walk around or change positions while lying down. °· Contractions get weaker and are shorter-lasting as time goes on. °· The cervix usually does not dilate or become thin. °Follow these instructions at home: °· Take over-the-counter and prescription medicines only as told by your health care provider. °· Keep up with your usual exercises and follow other instructions from your health care provider. °· Eat and drink lightly if you think you are going into labor. °· If Braxton Hicks contractions are making you uncomfortable: °? Change your position from lying down or resting to walking, or change from walking to resting. °? Sit and rest in a tub of warm water. °? Drink enough fluid to keep your urine pale yellow. Dehydration may cause these contractions. °? Do slow and deep breathing several times an hour. °· Keep all follow-up prenatal visits as told by your health care provider. This is important. °Contact a health care provider if: °· You have a fever. °· You have continuous pain in your abdomen. °Get help right away if: °· Your contractions become stronger, more regular, and closer together. °· You have fluid leaking or gushing from your vagina. °· You pass blood-tinged mucus (bloody show). °· You have bleeding from your vagina. °· You have low back pain that you never had before. °· You feel your baby’s head pushing down and causing pelvic pressure. °· Your baby is not moving inside you as much as it used to. °Summary °· Contractions that occur before labor are called Braxton   Hicks contractions, false labor, or practice contractions. °· Braxton Hicks contractions are usually shorter, weaker, farther apart, and less regular than true labor contractions. True labor contractions usually become progressively stronger and regular and they become more frequent. °· Manage discomfort from Braxton Hicks contractions by  changing position, resting in a warm bath, drinking plenty of water, or practicing deep breathing. °This information is not intended to replace advice given to you by your health care provider. Make sure you discuss any questions you have with your health care provider. °Document Released: 10/05/2016 Document Revised: 10/05/2016 Document Reviewed: 10/05/2016 °Elsevier Interactive Patient Education © 2018 [employer] ° °

## 2018-04-05 NOTE — Addendum Note (Signed)
Addended by: Cornelius Moras D on: 04/05/2018 10:59 AM   Modules accepted: Orders

## 2018-04-05 NOTE — Progress Notes (Signed)
  Subjective  Fetal Movement? yes Contractions? no Leaking Fluid? no Vaginal Bleeding? no  Objective  BP 100/60   Wt 190 lb (86.2 kg)   LMP 08/09/2017   BMI 32.61 kg/m  General: NAD Pumonary: no increased work of breathing Abdomen: gravid, non-tender Extremities: no edema Psychiatric: mood appropriate, affect full  Assessment  22 y.o. G2P0010 at [redacted]w[redacted]d by  05/16/2018, by Last Menstrual Period presenting for routine prenatal visit  Plan   Problem List Items Addressed This Visit      Other   Suprvsn of high risk preg due to social problems, unsp tri   Pregnancy - Primary    Other Visit Diagnoses    Substance abuse affecting pregnancy in third trimester, antepartum        PNV, [facility]  Annamarie Major, MD, [facility], [facility] 04/05/2018  10:45 AM

## 2018-04-10 ENCOUNTER — Telehealth: Payer: Self-pay

## 2018-04-10 NOTE — Telephone Encounter (Signed)
Pt called about what she could take for a yeast infection and I have advised her she can do Monistat over the counter 7 days is better then the 3 and 3 is better then 1. I have also advised her to call us back if nothing is getting better she will probably need to be seen because it could be something else.

## 2018-04-19 ENCOUNTER — Other Ambulatory Visit (HOSPITAL_COMMUNITY)
Admission: RE | Admit: 2018-04-19 | Discharge: 2018-04-19 | Disposition: A | Discharge status: Home / Self Care | Payer: Medicaid Other | Source: Ambulatory Visit | Attending: Maternal Newborn | Admitting: Maternal Newborn

## 2018-04-19 ENCOUNTER — Encounter: Payer: Self-pay | Admitting: Maternal Newborn

## 2018-04-19 ENCOUNTER — Ambulatory Visit (INDEPENDENT_AMBULATORY_CARE_PROVIDER_SITE_OTHER): Payer: Medicaid Other | Admitting: Maternal Newborn

## 2018-04-19 VITALS — BP 118/70 | Wt 191.0 lb

## 2018-04-19 DIAGNOSIS — O0970 Supervision of high risk pregnancy due to social problems, unspecified trimester: Secondary | ICD-10-CM | POA: Diagnosis not present

## 2018-04-19 DIAGNOSIS — Z113 Encounter for screening for infections with a predominantly sexual mode of transmission: Secondary | ICD-10-CM | POA: Insufficient documentation

## 2018-04-19 DIAGNOSIS — Z3A36 36 weeks gestation of pregnancy: Secondary | ICD-10-CM

## 2018-04-19 LAB — POCT URINALYSIS DIPSTICK OB
Glucose, UA: NEGATIVE
POC,PROTEIN,UA: NEGATIVE

## 2018-04-19 NOTE — Progress Notes (Signed)
ROB and GBS today- cramping in pelvic area for a week now
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
## 2018-04-21 LAB — STREP GP B NAA: STREP GROUP B AG: POSITIVE — AB

## 2018-04-22 LAB — CERVICOVAGINAL ANCILLARY ONLY
Chlamydia: NEGATIVE
Neisseria Gonorrhea: NEGATIVE

## 2018-04-22 NOTE — Telephone Encounter (Signed)
Pt called nurse line this am c same question.  Adv pt there is nothing to do now but when she goes into labor they will give her penicillin thru her IV.

## 2018-04-26 ENCOUNTER — Encounter: Payer: Self-pay | Admitting: Advanced Practice Midwife

## 2018-04-26 ENCOUNTER — Ambulatory Visit (INDEPENDENT_AMBULATORY_CARE_PROVIDER_SITE_OTHER): Payer: Medicaid Other | Admitting: Advanced Practice Midwife

## 2018-04-26 VITALS — BP 104/70 | Wt 194.0 lb

## 2018-04-26 DIAGNOSIS — Z3A37 37 weeks gestation of pregnancy: Secondary | ICD-10-CM

## 2018-04-26 DIAGNOSIS — O09293 Supervision of pregnancy with other poor reproductive or obstetric history, third trimester: Secondary | ICD-10-CM | POA: Diagnosis not present

## 2018-04-26 LAB — POCT URINALYSIS DIPSTICK OB
GLUCOSE, UA: NEGATIVE
POC,PROTEIN,UA: NEGATIVE

## 2018-04-26 NOTE — Progress Notes (Signed)
ROB- no concerns 
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
## 2018-04-26 NOTE — Patient Instructions (Signed)
Vaginal Delivery Vaginal delivery means that you will give birth by pushing your baby out of your birth canal (vagina). A team of health care providers will help you before, during, and after vaginal delivery. Birth experiences are unique for every woman and every pregnancy, and birth experiences vary depending on where you choose to give birth. What should I do to prepare for my baby's birth? Before your baby is born, it is important to talk with your health care provider about:  Your labor and delivery preferences. These may include: ? Medicines that you may be given. ? How you will manage your pain. This might include non-medical pain relief techniques or injectable pain relief such as epidural analgesia. ? How you and your baby will be monitored during labor and delivery. ? Who may be in the labor and delivery room with you. ? Your feelings about surgical delivery of your baby (cesarean delivery, or C-section) if this becomes necessary. ? Your feelings about receiving donated blood through an IV tube (blood transfusion) if this becomes necessary.  Whether you are able: ? To take pictures or videos of the birth. ? To eat during labor and delivery. ? To move around, walk, or change positions during labor and delivery.  What to expect after your baby is born, such as: ? Whether delayed umbilical cord clamping and cutting is offered. ? Who will care for your baby right after birth. ? Medicines or tests that may be recommended for your baby. ? Whether breastfeeding is supported in your hospital or birth center. ? How long you will be in the hospital or birth center.  How any medical conditions you have may affect your baby or your labor and delivery experience.  To prepare for your baby's birth, you should also:  Attend all of your health care visits before delivery (prenatal visits) as recommended by your health care provider. This is important.  Prepare your home for your baby's  arrival. Make sure that you have: ? Diapers. ? Baby clothing. ? Feeding equipment. ? Safe sleeping arrangements for you and your baby.  Install a car seat in your vehicle. Have your car seat checked by a certified car seat installer to make sure that it is installed safely.  Think about who will help you with your new baby at home for at least the first several weeks after delivery.  What can I expect when I arrive at the birth center or hospital? Once you are in labor and have been admitted into the hospital or birth center, your health care provider may:  Review your pregnancy history and any concerns you have.  Insert an IV tube into one of your veins. This is used to give you fluids and medicines.  Check your blood pressure, pulse, temperature, and heart rate (vital signs).  Check whether your bag of water (amniotic sac) has broken (ruptured).  Talk with you about your birth plan and discuss pain control options.  Monitoring Your health care provider may monitor your contractions (uterine monitoring) and your baby's heart rate (fetal monitoring). You may need to be monitored:  Often, but not continuously (intermittently).  All the time or for long periods at a time (continuously). Continuous monitoring may be needed if: ? You are taking certain medicines, such as medicine to relieve pain or make your contractions stronger. ? You have pregnancy or labor complications.  Monitoring may be done by:  Placing a special stethoscope or a handheld monitoring device on your abdomen to   check your baby's heartbeat, and feeling your abdomen for contractions. This method of monitoring does not continuously record your baby's heartbeat or your contractions.  Placing monitors on your abdomen (external monitors) to record your baby's heartbeat and the frequency and length of contractions. You may not have to wear external monitors all the time.  Placing monitors inside of your uterus  (internal monitors) to record your baby's heartbeat and the frequency, length, and strength of your contractions. ? Your health care provider may use internal monitors if he or she needs more information about the strength of your contractions or your baby's heart rate. ? Internal monitors are put in place by passing a thin, flexible wire through your vagina and into your uterus. Depending on the type of monitor, it may remain in your uterus or on your baby's head until birth. ? Your health care provider will discuss the benefits and risks of internal monitoring with you and will ask for your permission before inserting the monitors.  Telemetry. This is a type of continuous monitoring that can be done with external or internal monitors. Instead of having to stay in bed, you are able to move around during telemetry. Ask your health care provider if telemetry is an option for you.  Physical exam Your health care provider may perform a physical exam. This may include:  Checking whether your baby is positioned: ? With the head toward your vagina (head-down). This is most common. ? With the head toward the top of your uterus (head-up or breech). If your baby is in a breech position, your health care provider may try to turn your baby to a head-down position so you can deliver vaginally. If it does not seem that your baby can be born vaginally, your provider may recommend surgery to deliver your baby. In rare cases, you may be able to deliver vaginally if your baby is head-up (breech delivery). ? Lying sideways (transverse). Babies that are lying sideways cannot be delivered vaginally.  Checking your cervix to determine: ? Whether it is thinning out (effacing). ? Whether it is opening up (dilating). ? How low your baby has moved into your birth canal.  What are the three stages of labor and delivery?  Normal labor and delivery is divided into the following three stages: Stage 1  Stage 1 is the  longest stage of labor, and it can last for hours or days. Stage 1 includes: ? Early labor. This is when contractions may be irregular, or regular and mild. Generally, early labor contractions are more than 10 minutes apart. ? Active labor. This is when contractions get longer, more regular, more frequent, and more intense. ? The transition phase. This is when contractions happen very close together, are very intense, and may last longer than during any other part of labor.  Contractions generally feel mild, infrequent, and irregular at first. They get stronger, more frequent (about every 2-3 minutes), and more regular as you progress from early labor through active labor and transition.  Many women progress through stage 1 naturally, but you may need help to continue making progress. If this happens, your health care provider may talk with you about: ? Rupturing your amniotic sac if it has not ruptured yet. ? Giving you medicine to help make your contractions stronger and more frequent.  Stage 1 ends when your cervix is completely dilated to 4 inches (10 cm) and completely effaced. This happens at the end of the transition phase. Stage 2  Once   your cervix is completely effaced and dilated to 4 inches (10 cm), you may start to feel an urge to push. It is common for the body to naturally take a rest before feeling the urge to push, especially if you received an epidural or certain other pain medicines. This rest period may last for up to 1-2 hours, depending on your unique labor experience.  During stage 2, contractions are generally less painful, because pushing helps relieve contraction pain. Instead of contraction pain, you may feel stretching and burning pain, especially when the widest part of your baby's head passes through the vaginal opening (crowning).  Your health care provider will closely monitor your pushing progress and your baby's progress through the vagina during stage 2.  Your  health care provider may massage the area of skin between your vaginal opening and anus (perineum) or apply warm compresses to your perineum. This helps it stretch as the baby's head starts to crown, which can help prevent perineal tearing. ? In some cases, an incision may be made in your perineum (episiotomy) to allow the baby to pass through the vaginal opening. An episiotomy helps to make the opening of the vagina larger to allow more room for the baby to fit through.  It is very important to breathe and focus so your health care provider can control the delivery of your baby's head. Your health care provider may have you decrease the intensity of your pushing, to help prevent perineal tearing.  After delivery of your baby's head, the shoulders and the rest of the body generally deliver very quickly and without difficulty.  Once your baby is delivered, the umbilical cord may be cut right away, or this may be delayed for 1-2 minutes, depending on your baby's health. This may vary among health care providers, hospitals, and birth centers.  If you and your baby are healthy enough, your baby may be placed on your chest or abdomen to help maintain the baby's temperature and to help you bond with each other. Some mothers and babies start breastfeeding at this time. Your health care team will dry your baby and help keep your baby warm during this time.  Your baby may need immediate care if he or she: ? Showed signs of distress during labor. ? Has a medical condition. ? Was born too early (prematurely). ? Had a bowel movement before birth (meconium). ? Shows signs of difficulty transitioning from being inside the uterus to being outside of the uterus. If you are planning to breastfeed, your health care team will help you begin a feeding. Stage 3  The third stage of labor starts immediately after the birth of your baby and ends after you deliver the placenta. The placenta is an organ that develops  during pregnancy to provide oxygen and nutrients to your baby in the womb.  Delivering the placenta may require some pushing, and you may have mild contractions. Breastfeeding can stimulate contractions to help you deliver the placenta.  After the placenta is delivered, your uterus should tighten (contract) and become firm. This helps to stop bleeding in your uterus. To help your uterus contract and to control bleeding, your health care provider may: ? Give you medicine by injection, through an IV tube, by mouth, or through your rectum (rectally). ? Massage your abdomen or perform a vaginal exam to remove any blood clots that are left in your uterus. ? Empty your bladder by placing a thin, flexible tube (catheter) into your bladder. ? Encourage   you to breastfeed your baby. After labor is over, you and your baby will be monitored closely to ensure that you are both healthy until you are ready to go home. Your health care team will teach you how to care for yourself and your baby. This information is not intended to replace advice given to you by your health care provider. Make sure you discuss any questions you have with your health care provider. Document Released: 02/29/2008 Document Revised: 12/10/2015 Document Reviewed: 06/06/2015 Elsevier Interactive Patient Education  2018 [employer] Inc[employer]  

## 2018-05-04 ENCOUNTER — Observation Stay
Admission: EM | Admit: 2018-05-04 | Discharge: 2018-05-04 | Disposition: A | Discharge status: Home / Self Care | Payer: Medicaid Other | Attending: Certified Nurse Midwife | Admitting: Certified Nurse Midwife

## 2018-05-04 DIAGNOSIS — O26899 Other specified pregnancy related conditions, unspecified trimester: Secondary | ICD-10-CM | POA: Diagnosis present

## 2018-05-04 DIAGNOSIS — Z3A38 38 weeks gestation of pregnancy: Secondary | ICD-10-CM | POA: Insufficient documentation

## 2018-05-04 DIAGNOSIS — O368130 Decreased fetal movements, third trimester, not applicable or unspecified: Secondary | ICD-10-CM

## 2018-05-04 DIAGNOSIS — O0970 Supervision of high risk pregnancy due to social problems, unspecified trimester: Secondary | ICD-10-CM

## 2018-05-04 DIAGNOSIS — R109 Unspecified abdominal pain: Secondary | ICD-10-CM | POA: Diagnosis present

## 2018-05-04 DIAGNOSIS — O26893 Other specified pregnancy related conditions, third trimester: Principal | ICD-10-CM | POA: Insufficient documentation

## 2018-05-04 DIAGNOSIS — O09299 Supervision of pregnancy with other poor reproductive or obstetric history, unspecified trimester: Secondary | ICD-10-CM
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
## 2018-05-04 NOTE — OB Triage Note (Signed)
Patient here for lower abdominal cramping for the last few days. No real pattern to it, nothing makes it worse or better. She denies LOF or bleeding, notes some discharge which seems normal for her. She denies burning with urination but notes that her urine is dark colored. She drinks 1-2 glasses of water a day.

## 2018-05-06 ENCOUNTER — Ambulatory Visit (INDEPENDENT_AMBULATORY_CARE_PROVIDER_SITE_OTHER): Payer: Medicaid Other | Admitting: Obstetrics & Gynecology

## 2018-05-06 VITALS — BP 100/60 | Wt 191.0 lb

## 2018-05-06 DIAGNOSIS — Z3A38 38 weeks gestation of pregnancy: Secondary | ICD-10-CM

## 2018-05-06 DIAGNOSIS — O0970 Supervision of high risk pregnancy due to social problems, unspecified trimester: Secondary | ICD-10-CM

## 2018-05-06 LAB — POCT URINALYSIS DIPSTICK OB: GLUCOSE, UA: NEGATIVE

## 2018-05-06 NOTE — Patient Instructions (Signed)
Braxton Hicks Contractions °Contractions of the uterus can occur throughout pregnancy, but they are not always a sign that you are in labor. You may have practice contractions called Braxton Hicks contractions. These false labor contractions are sometimes confused with true labor. °What are Braxton Hicks contractions? °Braxton Hicks contractions are tightening movements that occur in the muscles of the uterus before labor. Unlike true labor contractions, these contractions do not result in opening (dilation) and thinning of the cervix. Toward the end of pregnancy (32-34 weeks), Braxton Hicks contractions can happen more often and may become stronger. These contractions are sometimes difficult to tell apart from true labor because they can be very uncomfortable. You should not feel embarrassed if you go to the hospital with false labor. °Sometimes, the only way to tell if you are in true labor is for your health care provider to look for changes in the cervix. The health care provider will do a physical exam and may monitor your contractions. If you are not in true labor, the exam should show that your cervix is not dilating and your water has not broken. °If there are other health problems associated with your pregnancy, it is completely safe for you to be sent home with false labor. You may continue to have Braxton Hicks contractions until you go into true labor. °How to tell the difference between true labor and false labor °True labor °· Contractions last 30-70 seconds. °· Contractions become very regular. °· Discomfort is usually felt in the top of the uterus, and it spreads to the lower abdomen and low back. °· Contractions do not go away with walking. °· Contractions usually become more intense and increase in frequency. °· The cervix dilates and gets thinner. °False labor °· Contractions are usually shorter and not as strong as true labor contractions. °· Contractions are usually irregular. °· Contractions  are often felt in the front of the lower abdomen and in the groin. °· Contractions may go away when you walk around or change positions while lying down. °· Contractions get weaker and are shorter-lasting as time goes on. °· The cervix usually does not dilate or become thin. °Follow these instructions at home: °· Take over-the-counter and prescription medicines only as told by your health care provider. °· Keep up with your usual exercises and follow other instructions from your health care provider. °· Eat and drink lightly if you think you are going into labor. °· If Braxton Hicks contractions are making you uncomfortable: °? Change your position from lying down or resting to walking, or change from walking to resting. °? Sit and rest in a tub of warm water. °? Drink enough fluid to keep your urine pale yellow. Dehydration may cause these contractions. °? Do slow and deep breathing several times an hour. °· Keep all follow-up prenatal visits as told by your health care provider. This is important. °Contact a health care provider if: °· You have a fever. °· You have continuous pain in your abdomen. °Get help right away if: °· Your contractions become stronger, more regular, and closer together. °· You have fluid leaking or gushing from your vagina. °· You pass blood-tinged mucus (bloody show). °· You have bleeding from your vagina. °· You have low back pain that you never had before. °· You feel your baby’s head pushing down and causing pelvic pressure. °· Your baby is not moving inside you as much as it used to. °Summary °· Contractions that occur before labor are called Braxton   Hicks contractions, false labor, or practice contractions. °· Braxton Hicks contractions are usually shorter, weaker, farther apart, and less regular than true labor contractions. True labor contractions usually become progressively stronger and regular and they become more frequent. °· Manage discomfort from Braxton Hicks contractions by  changing position, resting in a warm bath, drinking plenty of water, or practicing deep breathing. °This information is not intended to replace advice given to you by your health care provider. Make sure you discuss any questions you have with your health care provider. °Document Released: 10/05/2016 Document Revised: 10/05/2016 Document Reviewed: 10/05/2016 °Elsevier Interactive Patient Education © 2018 [employer] ° °

## 2018-05-06 NOTE — Progress Notes (Signed)
  Subjective  Fetal Movement? yes Contractions? no Leaking Fluid? no Vaginal Bleeding? no  Objective  BP 100/60   Wt 191 lb (86.6 kg)   LMP 08/09/2017   BMI 32.79 kg/m  General: NAD Pumonary: no increased work of breathing Abdomen: gravid, non-tender Extremities: no edema Psychiatric: mood appropriate, affect full  Assessment  22 y.o. G2P0010 at 4111w4d by  05/16/2018, by Last Menstrual Period presenting for routine prenatal visit  Plan   Problem List Items Addressed This Visit      Other   Suprvsn of high risk preg due to social problems, unsp tri   Pregnancy - Primary    PNV, FMC, Labor precautions, Exam next week  Martha MajorPaul Milderd Manocchio, MD, Merlinda FrederickFACOG [facility], [facility] 05/06/2018  10:47 AM

## 2018-05-06 NOTE — Addendum Note (Signed)
Addended by: Cornelius MorasPATTERSON, Ranjit Ashurst D on: 05/06/2018 11:10 AM   Modules accepted: Orders

## 2018-05-13 ENCOUNTER — Ambulatory Visit (INDEPENDENT_AMBULATORY_CARE_PROVIDER_SITE_OTHER): Payer: Medicaid Other | Admitting: Maternal Newborn

## 2018-05-13 ENCOUNTER — Encounter: Payer: Self-pay | Admitting: Maternal Newborn

## 2018-05-13 VITALS — BP 100/60 | Wt 194.0 lb

## 2018-05-13 DIAGNOSIS — Z3A39 39 weeks gestation of pregnancy: Secondary | ICD-10-CM

## 2018-05-13 DIAGNOSIS — O09293 Supervision of pregnancy with other poor reproductive or obstetric history, third trimester: Secondary | ICD-10-CM

## 2018-05-13 DIAGNOSIS — O0970 Supervision of high risk pregnancy due to social problems, unspecified trimester: Secondary | ICD-10-CM

## 2018-05-13 LAB — POCT URINALYSIS DIPSTICK OB
Glucose, UA: NEGATIVE
POC,PROTEIN,UA: NEGATIVE

## 2018-05-13 NOTE — Patient Instructions (Signed)
Vaginal Delivery Vaginal delivery means that you will give birth by pushing your baby out of your birth canal (vagina). A team of health care providers will help you before, during, and after vaginal delivery. Birth experiences are unique for every woman and every pregnancy, and birth experiences vary depending on where you choose to give birth. What should I do to prepare for my baby's birth? Before your baby is born, it is important to talk with your health care provider about:  Your labor and delivery preferences. These may include: ? Medicines that you may be given. ? How you will manage your pain. This might include non-medical pain relief techniques or injectable pain relief such as epidural analgesia. ? How you and your baby will be monitored during labor and delivery. ? Who may be in the labor and delivery room with you. ? Your feelings about surgical delivery of your baby (cesarean delivery, or C-section) if this becomes necessary. ? Your feelings about receiving donated blood through an IV tube (blood transfusion) if this becomes necessary.  Whether you are able: ? To take pictures or videos of the birth. ? To eat during labor and delivery. ? To move around, walk, or change positions during labor and delivery.  What to expect after your baby is born, such as: ? Whether delayed umbilical cord clamping and cutting is offered. ? Who will care for your baby right after birth. ? Medicines or tests that may be recommended for your baby. ? Whether breastfeeding is supported in your hospital or birth center. ? How long you will be in the hospital or birth center.  How any medical conditions you have may affect your baby or your labor and delivery experience.  To prepare for your baby's birth, you should also:  Attend all of your health care visits before delivery (prenatal visits) as recommended by your health care provider. This is important.  Prepare your home for your baby's  arrival. Make sure that you have: ? Diapers. ? Baby clothing. ? Feeding equipment. ? Safe sleeping arrangements for you and your baby.  Install a car seat in your vehicle. Have your car seat checked by a certified car seat installer to make sure that it is installed safely.  Think about who will help you with your new baby at home for at least the first several weeks after delivery.  What can I expect when I arrive at the birth center or hospital? Once you are in labor and have been admitted into the hospital or birth center, your health care provider may:  Review your pregnancy history and any concerns you have.  Insert an IV tube into one of your veins. This is used to give you fluids and medicines.  Check your blood pressure, pulse, temperature, and heart rate (vital signs).  Check whether your bag of water (amniotic sac) has broken (ruptured).  Talk with you about your birth plan and discuss pain control options.  Monitoring Your health care provider may monitor your contractions (uterine monitoring) and your baby's heart rate (fetal monitoring). You may need to be monitored:  Often, but not continuously (intermittently).  All the time or for long periods at a time (continuously). Continuous monitoring may be needed if: ? You are taking certain medicines, such as medicine to relieve pain or make your contractions stronger. ? You have pregnancy or labor complications.  Monitoring may be done by:  Placing a special stethoscope or a handheld monitoring device on your abdomen to   check your baby's heartbeat, and feeling your abdomen for contractions. This method of monitoring does not continuously record your baby's heartbeat or your contractions.  Placing monitors on your abdomen (external monitors) to record your baby's heartbeat and the frequency and length of contractions. You may not have to wear external monitors all the time.  Placing monitors inside of your uterus  (internal monitors) to record your baby's heartbeat and the frequency, length, and strength of your contractions. ? Your health care provider may use internal monitors if he or she needs more information about the strength of your contractions or your baby's heart rate. ? Internal monitors are put in place by passing a thin, flexible wire through your vagina and into your uterus. Depending on the type of monitor, it may remain in your uterus or on your baby's head until birth. ? Your health care provider will discuss the benefits and risks of internal monitoring with you and will ask for your permission before inserting the monitors.  Telemetry. This is a type of continuous monitoring that can be done with external or internal monitors. Instead of having to stay in bed, you are able to move around during telemetry. Ask your health care provider if telemetry is an option for you.  Physical exam Your health care provider may perform a physical exam. This may include:  Checking whether your baby is positioned: ? With the head toward your vagina (head-down). This is most common. ? With the head toward the top of your uterus (head-up or breech). If your baby is in a breech position, your health care provider may try to turn your baby to a head-down position so you can deliver vaginally. If it does not seem that your baby can be born vaginally, your provider may recommend surgery to deliver your baby. In rare cases, you may be able to deliver vaginally if your baby is head-up (breech delivery). ? Lying sideways (transverse). Babies that are lying sideways cannot be delivered vaginally.  Checking your cervix to determine: ? Whether it is thinning out (effacing). ? Whether it is opening up (dilating). ? How low your baby has moved into your birth canal.  What are the three stages of labor and delivery?  Normal labor and delivery is divided into the following three stages: Stage 1  Stage 1 is the  longest stage of labor, and it can last for hours or days. Stage 1 includes: ? Early labor. This is when contractions may be irregular, or regular and mild. Generally, early labor contractions are more than 10 minutes apart. ? Active labor. This is when contractions get longer, more regular, more frequent, and more intense. ? The transition phase. This is when contractions happen very close together, are very intense, and may last longer than during any other part of labor.  Contractions generally feel mild, infrequent, and irregular at first. They get stronger, more frequent (about every 2-3 minutes), and more regular as you progress from early labor through active labor and transition.  Many women progress through stage 1 naturally, but you may need help to continue making progress. If this happens, your health care provider may talk with you about: ? Rupturing your amniotic sac if it has not ruptured yet. ? Giving you medicine to help make your contractions stronger and more frequent.  Stage 1 ends when your cervix is completely dilated to 4 inches (10 cm) and completely effaced. This happens at the end of the transition phase. Stage 2  Once   your cervix is completely effaced and dilated to 4 inches (10 cm), you may start to feel an urge to push. It is common for the body to naturally take a rest before feeling the urge to push, especially if you received an epidural or certain other pain medicines. This rest period may last for up to 1-2 hours, depending on your unique labor experience.  During stage 2, contractions are generally less painful, because pushing helps relieve contraction pain. Instead of contraction pain, you may feel stretching and burning pain, especially when the widest part of your baby's head passes through the vaginal opening (crowning).  Your health care provider will closely monitor your pushing progress and your baby's progress through the vagina during stage 2.  Your  health care provider may massage the area of skin between your vaginal opening and anus (perineum) or apply warm compresses to your perineum. This helps it stretch as the baby's head starts to crown, which can help prevent perineal tearing. ? In some cases, an incision may be made in your perineum (episiotomy) to allow the baby to pass through the vaginal opening. An episiotomy helps to make the opening of the vagina larger to allow more room for the baby to fit through.  It is very important to breathe and focus so your health care provider can control the delivery of your baby's head. Your health care provider may have you decrease the intensity of your pushing, to help prevent perineal tearing.  After delivery of your baby's head, the shoulders and the rest of the body generally deliver very quickly and without difficulty.  Once your baby is delivered, the umbilical cord may be cut right away, or this may be delayed for 1-2 minutes, depending on your baby's health. This may vary among health care providers, hospitals, and birth centers.  If you and your baby are healthy enough, your baby may be placed on your chest or abdomen to help maintain the baby's temperature and to help you bond with each other. Some mothers and babies start breastfeeding at this time. Your health care team will dry your baby and help keep your baby warm during this time.  Your baby may need immediate care if he or she: ? Showed signs of distress during labor. ? Has a medical condition. ? Was born too early (prematurely). ? Had a bowel movement before birth (meconium). ? Shows signs of difficulty transitioning from being inside the uterus to being outside of the uterus. If you are planning to breastfeed, your health care team will help you begin a feeding. Stage 3  The third stage of labor starts immediately after the birth of your baby and ends after you deliver the placenta. The placenta is an organ that develops  during pregnancy to provide oxygen and nutrients to your baby in the womb.  Delivering the placenta may require some pushing, and you may have mild contractions. Breastfeeding can stimulate contractions to help you deliver the placenta.  After the placenta is delivered, your uterus should tighten (contract) and become firm. This helps to stop bleeding in your uterus. To help your uterus contract and to control bleeding, your health care provider may: ? Give you medicine by injection, through an IV tube, by mouth, or through your rectum (rectally). ? Massage your abdomen or perform a vaginal exam to remove any blood clots that are left in your uterus. ? Empty your bladder by placing a thin, flexible tube (catheter) into your bladder. ? Encourage   you to breastfeed your baby. After labor is over, you and your baby will be monitored closely to ensure that you are both healthy until you are ready to go home. Your health care team will teach you how to care for yourself and your baby. This information is not intended to replace advice given to you by your health care provider. Make sure you discuss any questions you have with your health care provider. Document Released: 02/29/2008 Document Revised: 12/10/2015 Document Reviewed: 06/06/2015 Elsevier Interactive Patient Education  2018 [employer] Inc[employer]  
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
## 2018-05-17 ENCOUNTER — Other Ambulatory Visit: Payer: Self-pay

## 2018-05-17 ENCOUNTER — Observation Stay
Admission: EM | Admit: 2018-05-17 | Discharge: 2018-05-17 | Disposition: A | Discharge status: Home / Self Care | Payer: Medicaid Other | Source: Home / Self Care | Admitting: Obstetrics and Gynecology

## 2018-05-17 DIAGNOSIS — Z3A40 40 weeks gestation of pregnancy: Secondary | ICD-10-CM | POA: Insufficient documentation

## 2018-05-17 DIAGNOSIS — O480 Post-term pregnancy: Secondary | ICD-10-CM

## 2018-05-17 DIAGNOSIS — O471 False labor at or after 37 completed weeks of gestation: Secondary | ICD-10-CM | POA: Diagnosis not present

## 2018-05-17 DIAGNOSIS — O09299 Supervision of pregnancy with other poor reproductive or obstetric history, unspecified trimester: Secondary | ICD-10-CM

## 2018-05-17 DIAGNOSIS — O0970 Supervision of high risk pregnancy due to social problems, unspecified trimester: Secondary | ICD-10-CM

## 2018-05-17 MED ORDER — ACETAMINOPHEN 325 MG PO TABS: 650.0000 mg | ORAL_TABLET | ORAL | Status: DC | PRN

## 2018-05-17 NOTE — Discharge Summary (Signed)
See Final Progress Note 05/17/2018. 

## 2018-05-17 NOTE — Progress Notes (Signed)
Patient discharged home with labor precautions. She verbalized understanding and was able to repeat when to return to the hospital.

## 2018-05-17 NOTE — OB Triage Note (Signed)
Patient here for contractions that are every 5-15 minutes apart for the last "while" they feel strong in her back but she cant feel her stomach tightening. NO lof or bleeding. Has induction scheduled for next Wednesday.

## 2018-05-17 NOTE — Final Progress Note (Signed)
Physician Final Progress Note  Patient ID: Martha [name] MRN: [id number] DOB/AGE: 01/25/1996 22 y.o.  Admit date: 05/17/2018 Admitting provider: Natale Milch, MD Discharge date: 05/17/2018   Admission Diagnoses: Contractions, labor check  Discharge Diagnoses:  Supervision of normal pregnancy Latent labor  History of Present Illness: The patient is a 22 y.o. female G2P0010 at [redacted]w[redacted]d who presents for contractions today that have been irregular but have ranged from 5 to 15 minutes apart for "a while." She has been feeling the contractions in her back. Denies loss of fluid and vaginal bleeding. Baby is moving well. She has been up walking a lot today.  Review of Systems: Review of systems negative unless otherwise noted in HPI.   Past Medical History:  Diagnosis Date  . Adjustment disorder with mixed anxiety and depressed mood   . Bipolar 1 disorder (HCC)   . Former smoker    stopped smoking 08/2017; did smoke 3/4 PPD  . History of substance use    cocaine and MJ    Past Surgical History:  Procedure Laterality Date  . NO PAST SURGERIES      No current facility-administered medications on file prior to encounter.    Current Outpatient Medications on File Prior to Encounter  Medication Sig Dispense Refill  . calcium carbonate (TUMS EX) 750 MG chewable tablet Chew 1 tablet by mouth as needed for heartburn.    . Prenatal Vit-Fe Fumarate-FA (PRENATAL MULTIVITAMIN) TABS tablet Take 1 tablet by mouth daily at 12 noon.      No Known Allergies  Social History   Socioeconomic History  . Marital status: Significant Other    Spouse name: Jonny Ruiz is FOB  . Number of children: 0  . Years of education: 54  . Highest education level: Not on file  Occupational History  . Occupation: homemaker  Social Needs  . Financial resource strain: Not on file  . Food insecurity:    Worry: Not on file    Inability: Not on file  . Transportation needs:    Medical: Not on file   Non-medical: Not on file  Tobacco Use  . Smoking status: Former Smoker    Packs/day: 0.25    Types: Cigarettes  . Smokeless tobacco: Never Used  Substance and Sexual Activity  . Alcohol use: Not Currently  . Drug use: Not Currently    Types: Marijuana    Comment: 2-3 weeks ago  . Sexual activity: Yes  Lifestyle  . Physical activity:    Days per week: Not on file    Minutes per session: Not on file  . Stress: Not on file  Relationships  . Social connections:    Talks on phone: Not on file    Gets together: Not on file    Attends religious service: Not on file    Active member of club or organization: Not on file    Attends meetings of clubs or organizations: Not on file    Relationship status: Not on file  . Intimate partner violence:    Fear of current or ex partner: Not on file    Emotionally abused: Not on file    Physically abused: Not on file    Forced sexual activity: Not on file  Other Topics Concern  . Not on file  Social History Narrative  . Not on file    Family history: Negative/unremarkable except as detailed in HPI. No family history of birth defects.   Physical Exam: BP 134/68   Pulse  97   Temp 98.2 F (36.8 C) (Oral)   LMP 08/09/2017   Gen: NAD CV: Regular rate Pulm: No increased work of breathing Pelvic: 0.5/50/-2 Ext: No signs of DVT on exam  NST Baseline: 125 Variability: moderate Accelerations: present Decelerations: absent Tocometry: occasional contractions The patient was monitored for 40+ minutes, fetal heart rate tracing was deemed reactive.  Consults: None  Significant Findings/ Diagnostic Studies: Reactive NST, Category I tracing  Procedures: NST  Discharge Condition: good  Disposition: Discharge disposition: 01-Home or Self Care       Diet: Regular diet  Discharge Activity: Activity as tolerated  Discharge Instructions    Discharge activity:  No Restrictions   Complete by:  As directed    Discharge diet:  No  restrictions   Complete by:  As directed    Fetal Kick Count:  Lie on our left side for one hour after a meal, and count the number of times your baby kicks.  If it is less than 5 times, get up, move around and drink some juice.  Repeat the test 30 minutes later.  If it is still less than 5 kicks in an hour, notify your doctor.   Complete by:  As directed    LABOR:  When conractions begin, you should start to time them from the beginning of one contraction to the beginning  of the next.  When contractions are 5 - 10 minutes apart or less and have been regular for at least an hour, you should call your health care provider.   Complete by:  As directed    No sexual activity restrictions   Complete by:  As directed    Notify physician for bleeding from the vagina   Complete by:  As directed    Notify physician for blurring of vision or spots before the eyes   Complete by:  As directed    Notify physician for chills or fever   Complete by:  As directed    Notify physician for fainting spells, "black outs" or loss of consciousness   Complete by:  As directed    Notify physician for increase in vaginal discharge   Complete by:  As directed    Notify physician for leaking of fluid   Complete by:  As directed    Notify physician for pain or burning when urinating   Complete by:  As directed    Notify physician for pelvic pressure (sudden increase)   Complete by:  As directed    Notify physician for severe or continued nausea or vomiting   Complete by:  As directed    Notify physician for sudden gushing of fluid from the vagina (with or without continued leaking)   Complete by:  As directed    Notify physician for sudden, constant, or occasional abdominal pain   Complete by:  As directed    Notify physician if baby moving less than usual   Complete by:  As directed      Allergies as of 05/17/2018   No Known Allergies     Medication List    TAKE these medications   calcium carbonate  750 MG chewable tablet Commonly known as:  TUMS EX Chew 1 tablet by mouth as needed for heartburn.   prenatal multivitamin Tabs tablet Take 1 tablet by mouth daily at 12 noon.      Offered extended observation with recheck of cervical exam. She prefers to discharge and is aware to return with any signs  of active labor or decrease in fetal activity. Induction scheduled for 05/22/2018.  Signed: Oswaldo Conroy, CNM  05/17/2018, 9:51 PM

## 2018-05-18 ENCOUNTER — Inpatient Hospital Stay
Admission: EM | Admit: 2018-05-18 | Discharge: 2018-05-20 | DRG: 807 | Disposition: A | Discharge status: Home / Self Care | Payer: Medicaid Other | Attending: Obstetrics and Gynecology | Admitting: Obstetrics and Gynecology

## 2018-05-18 ENCOUNTER — Other Ambulatory Visit: Payer: Self-pay

## 2018-05-18 ENCOUNTER — Inpatient Hospital Stay: Payer: Medicaid Other | Admitting: Anesthesiology

## 2018-05-18 ENCOUNTER — Encounter: Payer: Self-pay | Admitting: *Deleted

## 2018-05-18 DIAGNOSIS — O09299 Supervision of pregnancy with other poor reproductive or obstetric history, unspecified trimester: Secondary | ICD-10-CM

## 2018-05-18 DIAGNOSIS — O480 Post-term pregnancy: Secondary | ICD-10-CM | POA: Diagnosis not present

## 2018-05-18 DIAGNOSIS — Z87891 Personal history of nicotine dependence: Secondary | ICD-10-CM

## 2018-05-18 DIAGNOSIS — O34219 Maternal care for unspecified type scar from previous cesarean delivery: Secondary | ICD-10-CM | POA: Diagnosis present

## 2018-05-18 DIAGNOSIS — Z3483 Encounter for supervision of other normal pregnancy, third trimester: Secondary | ICD-10-CM | POA: Diagnosis present

## 2018-05-18 DIAGNOSIS — Z3A40 40 weeks gestation of pregnancy: Secondary | ICD-10-CM

## 2018-05-18 DIAGNOSIS — O99824 Streptococcus B carrier state complicating childbirth: Secondary | ICD-10-CM | POA: Diagnosis present

## 2018-05-18 DIAGNOSIS — O0970 Supervision of high risk pregnancy due to social problems, unspecified trimester: Secondary | ICD-10-CM

## 2018-05-18 LAB — CBC
HCT: 34 % — ABNORMAL LOW (ref 36.0–46.0)
Hemoglobin: 11.2 g/dL — ABNORMAL LOW (ref 12.0–15.0)
MCH: 27 pg (ref 26.0–34.0)
MCHC: 32.9 g/dL (ref 30.0–36.0)
MCV: 81.9 fL (ref 80.0–100.0)
Platelets: 299 10*3/uL (ref 150–400)
RBC: 4.15 MIL/uL (ref 3.87–5.11)
RDW: 13.2 % (ref 11.5–15.5)
WBC: 16.5 10*3/uL — ABNORMAL HIGH (ref 4.0–10.5)
nRBC: 0 % (ref 0.0–0.2)

## 2018-05-18 LAB — CHLAMYDIA/NGC RT PCR (ARMC ONLY)
Chlamydia Tr: NOT DETECTED
N gonorrhoeae: NOT DETECTED

## 2018-05-18 LAB — TYPE AND SCREEN
ABO/RH(D): O POS
Antibody Screen: NEGATIVE

## 2018-05-18 MED ORDER — DIPHENHYDRAMINE HCL 50 MG/ML IJ SOLN: 12.5000 mg | INTRAMUSCULAR | Status: DC | PRN

## 2018-05-18 MED ORDER — OXYTOCIN BOLUS FROM INFUSION
500.0000 mL | Freq: Once | INTRAVENOUS | Status: AC
  Administered 2018-05-19: 500 mL via INTRAVENOUS

## 2018-05-18 MED ORDER — MISOPROSTOL 200 MCG PO TABS
ORAL_TABLET | ORAL | Status: AC
  Filled 2018-05-18: qty 4

## 2018-05-18 MED ORDER — EPHEDRINE 5 MG/ML INJ: 10.0000 mg | INTRAVENOUS | Status: DC | PRN

## 2018-05-18 MED ORDER — OXYTOCIN 40 UNITS IN LACTATED RINGERS INFUSION - SIMPLE MED
1.0000 m[IU]/min | INTRAVENOUS | Status: DC
  Administered 2018-05-18: 2 m[IU]/min via INTRAVENOUS

## 2018-05-18 MED ORDER — LIDOCAINE-EPINEPHRINE (PF) 1.5 %-1:200000 IJ SOLN
INTRAMUSCULAR | Status: DC | PRN
  Administered 2018-05-18: 3 mL via EPIDURAL

## 2018-05-18 MED ORDER — SOD CITRATE-CITRIC ACID 500-334 MG/5ML PO SOLN
30.0000 mL | ORAL | Status: DC | PRN
  Administered 2018-05-19: 30 mL via ORAL
  Filled 2018-05-18: qty 15

## 2018-05-18 MED ORDER — SODIUM CHLORIDE 0.9 % IV SOLN
5.0000 10*6.[IU] | Freq: Once | INTRAVENOUS | Status: AC
  Administered 2018-05-18: 5 10*6.[IU] via INTRAVENOUS
  Filled 2018-05-18: qty 5

## 2018-05-18 MED ORDER — SODIUM CHLORIDE 0.9 % IV SOLN
INTRAVENOUS | Status: DC | PRN
  Administered 2018-05-18 (×2): 5 mL via EPIDURAL

## 2018-05-18 MED ORDER — LIDOCAINE HCL (PF) 1 % IJ SOLN
INTRAMUSCULAR | Status: DC | PRN
  Administered 2018-05-18 (×2): 1 mL via INTRADERMAL

## 2018-05-18 MED ORDER — PENICILLIN G 3 MILLION UNITS IVPB - SIMPLE MED
3.0000 10*6.[IU] | INTRAVENOUS | Status: DC
  Administered 2018-05-18: 3 10*6.[IU] via INTRAVENOUS
  Filled 2018-05-18 (×5): qty 100

## 2018-05-18 MED ORDER — OXYTOCIN 40 UNITS IN LACTATED RINGERS INFUSION - SIMPLE MED
INTRAVENOUS | Status: AC
  Filled 2018-05-18: qty 1000

## 2018-05-18 MED ORDER — PHENYLEPHRINE 40 MCG/ML (10ML) SYRINGE FOR IV PUSH (FOR BLOOD PRESSURE SUPPORT): 80.0000 ug | PREFILLED_SYRINGE | INTRAVENOUS | Status: DC | PRN

## 2018-05-18 MED ORDER — LACTATED RINGERS IV SOLN: 500.0000 mL | INTRAVENOUS | Status: DC | PRN

## 2018-05-18 MED ORDER — ONDANSETRON HCL 4 MG/2ML IJ SOLN: 4.0000 mg | Freq: Four times a day (QID) | INTRAMUSCULAR | Status: DC | PRN

## 2018-05-18 MED ORDER — FENTANYL 2.5 MCG/ML W/ROPIVACAINE 0.15% IN NS 100 ML EPIDURAL (ARMC)
12.0000 mL/h | EPIDURAL | Status: DC
  Administered 2018-05-18: 12 mL/h via EPIDURAL
  Filled 2018-05-18: qty 100

## 2018-05-18 MED ORDER — LACTATED RINGERS IV SOLN
INTRAVENOUS | Status: DC
  Administered 2018-05-18 – 2018-05-19 (×2): via INTRAVENOUS

## 2018-05-18 MED ORDER — OXYTOCIN 40 UNITS IN LACTATED RINGERS INFUSION - SIMPLE MED
2.5000 [IU]/h | INTRAVENOUS | Status: DC
  Administered 2018-05-19: 2.5 [IU]/h via INTRAVENOUS

## 2018-05-18 MED ORDER — AMMONIA AROMATIC IN INHA
RESPIRATORY_TRACT | Status: AC
  Filled 2018-05-18: qty 10

## 2018-05-18 MED ORDER — LACTATED RINGERS IV SOLN: 500.0000 mL | Freq: Once | INTRAVENOUS | Status: DC

## 2018-05-18 MED ORDER — BUTORPHANOL TARTRATE 2 MG/ML IJ SOLN: 1.0000 mg | INTRAMUSCULAR | Status: DC | PRN

## 2018-05-18 MED ORDER — ACETAMINOPHEN 325 MG PO TABS: 650.0000 mg | ORAL_TABLET | ORAL | Status: DC | PRN

## 2018-05-18 MED ORDER — TERBUTALINE SULFATE 1 MG/ML IJ SOLN: 0.2500 mg | Freq: Once | INTRAMUSCULAR | Status: DC | PRN

## 2018-05-18 MED ORDER — FENTANYL 2.5 MCG/ML W/ROPIVACAINE 0.15% IN NS 100 ML EPIDURAL (ARMC)
EPIDURAL | Status: AC
  Filled 2018-05-18: qty 100

## 2018-05-18 MED ORDER — LIDOCAINE HCL (PF) 1 % IJ SOLN
30.0000 mL | INTRAMUSCULAR | Status: DC | PRN
  Filled 2018-05-18: qty 30

## 2018-05-18 MED ORDER — OXYTOCIN 10 UNIT/ML IJ SOLN
INTRAMUSCULAR | Status: AC
  Filled 2018-05-18: qty 2

## 2018-05-18 NOTE — H&P (Signed)
Obstetric H&P   Chief Complaint: Contractions  Prenatal Care Provider: [facility]  History of Present Illness: 22 y.o. G2P0010 79w2dby 05/16/2018, by Last Menstrual Period presenting to L&D with contractions.  No LOF, no VB, +FM.  Presented with same yesterday and discharged at fingertip.  1cm on presentation today with change to 3cm over 3-hr evaluation.    Pregravid weight 70.3 kg Total Weight Gain 17.7 kg  pregnancy 2 Problems (from 02/01/18 to present)    Problem Noted Resolved   Suprvsn of high risk preg due to social problems, unsp tri 02/01/2018 by GRod Can CNM No   Overview Addendum 03/24/2018  1:29 PM by GDalia Heading CTrippPrenatal Labs  Dating  LMP =12 wk UKoreaBlood type: --/--/O POS  (02/26 1504)   Genetic Screen none Antibody:Negative (05/07 0000)  Anatomic [place]Complete, normal Rubella:    MMR x2 Varicella: Varivax x2  GTT 89 RPR: Non Reactive (09/20 1106)   Rhogam Not needed HBsAg: Negative (05/07 0000)   TDaP vaccine    03/22/18                    Flu Shot: 03/22/18 HIV: Non Reactive (09/20 1106)   Baby Food    Breast/bottle                     GBS: positive  Contraception Undecided, considering pill Pap: 08/01/2017 NIL  CBB     CS/VBAC NA   Support Person Husband [name]          History of spontaneous abortion, currently pregnant 02/01/2018 by GRod Can CNM No       Review of Systems: 10 point review of systems negative unless otherwise noted in HPI  Past Medical History: Past Medical History:  Diagnosis Date  . Adjustment disorder with mixed anxiety and depressed mood   . [place]   . Former smoker    stopped smoking 08/2017; did smoke 3/4 PPD  . History of substance use    cocaine and MJ    Past Surgical History: Past Surgical History:  Procedure Laterality Date  . NO PAST SURGERIES      Past Obstetric History: #: 1, Date: 2019, Sex: None, Weight: None, GA: None, Delivery: None, Apgar1: None, Apgar5:  None, Living: None, Birth Comments: None  #: 2, Date: None, Sex: None, Weight: None, GA: None, Delivery: None, Apgar1: None, Apgar5: None, Living: None, Birth Comments: None   Past Gynecologic History:  Family History: Family History  Problem Relation Age of Onset  . COPD Maternal Grandmother   . Hypertension Maternal Grandmother   . Diabetes Maternal Grandfather   . Hypertension Maternal Grandfather   . Heart attack Maternal Grandfather        stents placed  . Endometriosis Mother   . Mental retardation Brother   . Gestational diabetes Maternal Aunt   . Autism Cousin        second cousin    Social History: Social History   Socioeconomic History  . Marital status: Significant Other    Spouse name: JJenny Reichmannis FOB  . Number of children: 0  . Years of education: 151 . Highest education level: Not on file  Occupational History  . Occupation: homemaker  Social Needs  . Financial resource strain: Not on file  . Food insecurity:    Worry: Not on file    Inability: Not on file  . Transportation needs:  Medical: Not on file    Non-medical: Not on file  Tobacco Use  . Smoking status: Former Smoker    Packs/day: 0.25    Types: Cigarettes  . Smokeless tobacco: Never Used  Substance and Sexual Activity  . Alcohol use: Not Currently  . Drug use: Not Currently    Types: Marijuana    Comment: 2-3 weeks ago  . Sexual activity: Yes  Lifestyle  . Physical activity:    Days per week: Not on file    Minutes per session: Not on file  . Stress: Not on file  Relationships  . Social connections:    Talks on phone: Not on file    Gets together: Not on file    Attends religious service: Not on file    Active member of club or organization: Not on file    Attends meetings of clubs or organizations: Not on file    Relationship status: Not on file  . Intimate partner violence:    Fear of current or ex partner: Not on file    Emotionally abused: Not on file    Physically abused:  Not on file    Forced sexual activity: Not on file  Other Topics Concern  . Not on file  Social History Narrative  . Not on file    Medications: Prior to Admission medications   Medication Sig Start Date End Date Taking? Authorizing Provider  calcium carbonate (TUMS EX) 750 MG chewable tablet Chew 1 tablet by mouth as needed for heartburn.   Yes [provider]  Prenatal Vit-Fe Fumarate-FA (PRENATAL MULTIVITAMIN) TABS tablet Take 1 tablet by mouth daily at 12 noon.   Yes [provider]    Allergies: No Known Allergies  Physical Exam: Vitals: Last menstrual period 08/09/2017, unknown if currently breastfeeding.  Urine Dip Protein: N/A  FHT: 125, moderate, +accels, no decels Toco: q5-16mn  General: NAD HEENT: normocephalic, anicteric Pulmonary: No increased work of breathing Cardiovascular: RRR, distal pulses 2+ Abdomen: Gravid, non-tender Leopolds: vtx Genitourinary: 3/90/-2  Extremities: no edema, erythema, or tenderness Neurologic: Grossly intact Psychiatric: mood appropriate, affect full  Labs: No results found for this or any previous visit (from the past 24 hour(s)).  Assessment: 22y.o. G2P0010 441w2dy 05/16/2018, by Last Menstrual Period early labor  Plan: 1) Early labor - expectant management  2) Fetus - cat I tracing  3) PNL - Blood type O/Positive/-- (05/07 0000) / Anti-bodyscreen Negative (05/07 0000) / RuCharlynn GrimesMRx2 / Varicella x 2 / RPR Non Reactive (09/20 1106) / HBsAg Negative (05/07 0000) / HIV Non Reactive (09/20 1106) / GBS Positive (11/15 1447)  4) Immunization History -  Immunization History  Administered Date(s) Administered  . Influenza,inj,Quad PF,6+ Mos 03/22/2018  . Tdap 03/22/2018    5) Disposition - pending delivery  AnMalachy MoodMD, FAEarlyCoOrettaroup 05/18/2018, 5:30 PM

## 2018-05-18 NOTE — Anesthesia Procedure Notes (Signed)
Epidural Patient location during procedure: OB Start time: 05/18/2018 9:06 PM End time: 05/18/2018 9:16 PM  Staffing Anesthesiologist: Piscitello, Cleda MccreedyJoseph K, MD Performed: anesthesiologist   Preanesthetic Checklist Completed: patient identified, site marked, surgical consent, pre-op evaluation, timeout performed, IV checked, risks and benefits discussed and monitors and equipment checked  Epidural Patient position: sitting Prep: ChloraPrep Patient monitoring: heart rate, continuous pulse ox and blood pressure Approach: midline Location: L3-L4 Injection technique: LOR saline  Needle:  Needle type: Tuohy  Needle gauge: 17 G Needle length: 9 cm and 9 Needle insertion depth: 6.5 cm Catheter type: closed end flexible Catheter size: 19 Gauge Catheter at skin depth: 11.5 cm Test dose: negative and 1.5% lidocaine with Epi 1:200 K  Assessment Sensory level: T10 Events: blood not aspirated, injection not painful, no injection resistance, negative IV test and no paresthesia  Additional Notes 2 attempts.  Left sided transient paresthesia with 2nd attempt with catheter placement that resolved with catheter retraction to 11.5 cm at the skin Pt. Evaluated and documentation done after procedure finished. Patient identified. Risks/Benefits/Options discussed with patient including but not limited to bleeding, infection, nerve damage, paralysis, failed block, incomplete pain control, headache, blood pressure changes, nausea, vomiting, reactions to medication both or allergic, itching and postpartum back pain. Confirmed with bedside nurse the patient's most recent platelet count. Confirmed with patient that they are not currently taking any anticoagulation, have any bleeding history or any family history of bleeding disorders. Patient expressed understanding and wished to proceed. All questions were answered. Sterile technique was used throughout the entire procedure. Please see nursing notes for  vital signs. Test dose was given through epidural catheter and negative prior to continuing to dose epidural or start infusion. Warning signs of high block given to the patient including shortness of breath, tingling/numbness in hands, complete motor block, or any concerning symptoms with instructions to call for help. Patient was given instructions on fall risk and not to get out of bed. All questions and concerns addressed with instructions to call with any issues or inadequate analgesia.   Patient tolerated the insertion well without immediate complications.Reason for block:procedure for pain

## 2018-05-18 NOTE — Progress Notes (Signed)
   Subjective:  Increasing contractions  Objective:   Vitals: Blood pressure 137/87, pulse (!) 102, temperature 97.8 F (36.6 C), temperature source Oral, resp. rate 16, height 5\' 4"  (1.626 m), weight 88 kg, last menstrual period 08/09/2017, unknown if currently breastfeeding. General: NAD Abdomen: soft, non-tender Cervical Exam:  Dilation: 4 Effacement (%): 100 Cervical Position: Anterior Station: -1 Presentation: Vertex Exam by:: Bonney AidStaebler, MD  FHT: 130, moderate, +accels, no decels Toco: 3-685min  Results for orders placed or performed during the hospital encounter of 05/18/18 (from the past 24 hour(s))  Type and screen [facility]     Status: None   Collection Time: 05/18/18  7:07 PM  Result Value Ref Range   ABO/RH(D) O POS    Antibody Screen NEG    Sample Expiration      05/21/2018 Performed at [facility], [address] [place], [place] [address]   CBC     Status: Abnormal   Collection Time: 05/18/18  7:09 PM  Result Value Ref Range   WBC 16.5 (H) 4.0 - 10.5 K/uL   RBC 4.15 3.87 - 5.11 MIL/uL   Hemoglobin 11.2 (L) 12.0 - 15.0 g/dL   HCT 60.434.0 (L) 54.036.0 - 98.146.0 %   MCV 81.9 80.0 - 100.0 fL   MCH 27.0 26.0 - 34.0 pg   MCHC 32.9 30.0 - 36.0 g/dL   RDW 19.113.2 47.811.5 - 29.515.5 %   Platelets 299 150 - 400 K/uL   nRBC 0.0 0.0 - 0.2 %    Assessment:   22 y.o. G2P0010 6573w2d   Plan:   1) Labor - AROM minimal return of fluid  2) Fetus - Cat I tracing  Vena AustriaAndreas Brax Walen, MD, Merlinda FrederickFACOG [facility], [facility] 05/18/2018, 8:48 PM

## 2018-05-18 NOTE — Anesthesia Preprocedure Evaluation (Signed)
Anesthesia Evaluation  Patient identified by MRN, date of birth, ID band Patient awake    Reviewed: Allergy & Precautions, H&P , NPO status , Patient's Chart, lab work & pertinent test results  History of Anesthesia Complications Negative for: history of anesthetic complications  Airway Mallampati: III  TM Distance: >3 FB Neck ROM: full    Dental  (+) Chipped, Poor Dentition   Pulmonary former smoker,           Cardiovascular Exercise Tolerance: Good (-) hypertensionnegative cardio ROS       Neuro/Psych    GI/Hepatic negative GI ROS,   Endo/Other    Renal/GU   negative genitourinary   Musculoskeletal   Abdominal   Peds  Hematology negative hematology ROS (+)   Anesthesia Other Findings Past Medical History: No date: Adjustment disorder with mixed anxiety and depressed mood No date: Bipolar 1 disorder (HCC) No date: Former smoker     Comment:  stopped smoking 08/2017; did smoke 3/4 PPD No date: History of substance use     Comment:  cocaine and MJ  Past Surgical History: No date: NO PAST SURGERIES  BMI    Body Mass Index:  33.30 kg/m      Reproductive/Obstetrics (+) Pregnancy                             Anesthesia Physical Anesthesia Plan  ASA: III  Anesthesia Plan: Epidural   Post-op Pain Management:    Induction:   PONV Risk Score and Plan:   Airway Management Planned:   Additional Equipment:   Intra-op Plan:   Post-operative Plan:   Informed Consent: I have reviewed the patients History and Physical, chart, labs and discussed the procedure including the risks, benefits and alternatives for the proposed anesthesia with the patient or authorized representative who has indicated his/her understanding and acceptance.     Plan Discussed with: Anesthesiologist  Anesthesia Plan Comments:         Anesthesia Quick Evaluation

## 2018-05-18 NOTE — OB Triage Note (Signed)
Patient to OBS 3 for increased intensity of contractions.  Contractions began around 330 am Friday morning.  She reports contractions continued through last night and became stronger around 1pm so she came in to get checked out. No LOF or bleeding.  She reports pressure on her bladder.

## 2018-05-19 DIAGNOSIS — O480 Post-term pregnancy: Secondary | ICD-10-CM

## 2018-05-19 DIAGNOSIS — O99824 Streptococcus B carrier state complicating childbirth: Secondary | ICD-10-CM

## 2018-05-19 DIAGNOSIS — Z3A40 40 weeks gestation of pregnancy: Secondary | ICD-10-CM

## 2018-05-19 MED ORDER — OXYCODONE-ACETAMINOPHEN 5-325 MG PO TABS: 1.0000 | ORAL_TABLET | ORAL | Status: DC | PRN

## 2018-05-19 MED ORDER — ONDANSETRON HCL 4 MG/2ML IJ SOLN: 4.0000 mg | INTRAMUSCULAR | Status: DC | PRN

## 2018-05-19 MED ORDER — BENZOCAINE-MENTHOL 20-0.5 % EX AERO
1.0000 "application " | INHALATION_SPRAY | CUTANEOUS | Status: DC | PRN
  Administered 2018-05-19: 1 via TOPICAL
  Filled 2018-05-19: qty 56

## 2018-05-19 MED ORDER — WITCH HAZEL-GLYCERIN EX PADS: 1.0000 "application " | MEDICATED_PAD | CUTANEOUS | Status: DC | PRN

## 2018-05-19 MED ORDER — COCONUT OIL OIL
1.0000 "application " | TOPICAL_OIL | Status: DC | PRN
  Administered 2018-05-20: 1 via TOPICAL
  Filled 2018-05-19: qty 120

## 2018-05-19 MED ORDER — SIMETHICONE 80 MG PO CHEW: 80.0000 mg | CHEWABLE_TABLET | ORAL | Status: DC | PRN

## 2018-05-19 MED ORDER — DIPHENHYDRAMINE HCL 25 MG PO CAPS: 25.0000 mg | ORAL_CAPSULE | Freq: Four times a day (QID) | ORAL | Status: DC | PRN

## 2018-05-19 MED ORDER — DIBUCAINE 1 % RE OINT: 1.0000 "application " | TOPICAL_OINTMENT | RECTAL | Status: DC | PRN

## 2018-05-19 MED ORDER — ONDANSETRON HCL 4 MG PO TABS: 4.0000 mg | ORAL_TABLET | ORAL | Status: DC | PRN

## 2018-05-19 MED ORDER — ACETAMINOPHEN 325 MG PO TABS
650.0000 mg | ORAL_TABLET | ORAL | Status: DC | PRN
  Filled 2018-05-19: qty 2

## 2018-05-19 MED ORDER — PRENATAL MULTIVITAMIN CH
1.0000 | ORAL_TABLET | Freq: Every day | ORAL | Status: DC
  Administered 2018-05-19 – 2018-05-20 (×2): 1 via ORAL
  Filled 2018-05-19 (×3): qty 1

## 2018-05-19 MED ORDER — OXYCODONE-ACETAMINOPHEN 5-325 MG PO TABS: 2.0000 | ORAL_TABLET | ORAL | Status: DC | PRN

## 2018-05-19 MED ORDER — SENNOSIDES-DOCUSATE SODIUM 8.6-50 MG PO TABS
2.0000 | ORAL_TABLET | ORAL | Status: DC
  Filled 2018-05-19 (×2): qty 2

## 2018-05-19 MED ORDER — IBUPROFEN 600 MG PO TABS
600.0000 mg | ORAL_TABLET | Freq: Four times a day (QID) | ORAL | Status: DC
  Administered 2018-05-19 – 2018-05-20 (×4): 600 mg via ORAL
  Filled 2018-05-19 (×5): qty 1

## 2018-05-19 NOTE — Discharge Summary (Signed)
Obstetric Discharge Summary Reason for Admission: onset of labor Prenatal Procedures: none Intrapartum Procedures: spontaneous vaginal delivery Postpartum Procedures: none Complications-Operative and Postpartum: none Hemoglobin  Date Value Ref Range Status  05/20/2018 8.6 (L) 12.0 - 15.0 g/dL Final  40/98/119109/20/2019 47.811.0 (L) 11.1 - 15.9 g/dL Final  29/56/213005/12/2017 86.512.4  Final   HCT  Date Value Ref Range Status  05/20/2018 26.7 (L) 36.0 - 46.0 % Final  10/09/2017 37 29 - 41 Final   Hematocrit  Date Value Ref Range Status  02/22/2018 32.6 (L) 34.0 - 46.6 % Final    Physical Exam:  General: alert, cooperative and no distress Lochia: appropriate Uterine Fundus: firm DVT Evaluation: No evidence of DVT seen on physical exam.  Discharge Diagnoses: Term Pregnancy-delivered  Discharge Information: Date: 05/20/2018 Activity: unrestricted and pelvic rest Diet: routine Condition: stable Discharge to: home Follow-up Information    Vena AustriaStaebler, Andreas, MD Follow up in 6 week(s).   Specialty:  Obstetrics and Gynecology Why:  postpartum Contact information: [address] [place] [address] [phone]           Newborn Data: Live born female  Birth Weight: 7 lb, 15.3 oz APGAR: 8, 9  Newborn Delivery   Birth date/time:  05/19/2018 04:56:00 Delivery type:  Vaginal, Spontaneous     Home with mother.  Oswaldo ConroyJacelyn Y Schmid 05/20/2018, 10:05 AM

## 2018-05-19 NOTE — Lactation Note (Signed)
This note was copied from a baby's chart. Lactation Consultation Note  Patient Name: Martha [name] Reason for consult: Initial assessment;Mother's request;Difficult latch;Primapara;Term Mom asking for assistance with breast feeding.  Initially Martha [name] was gagging and spitting yellow & mucousy emesis.  Once Martha [name] got up a lot of emesis, we were able to get him to latch. Demonstrated hand expression and got lots of colostrum. We could get him to latch, but he kept sucking in his lower lip and losing suction, coming on and off the breast.  After 5 minutes of rhythmic sucking and then stirring around and losing suction, he started gagging and spitting small amounts again.  Reviewed supply and demand, normal course of lactation and routine newborn feeding patterns.  Reassured mom that this was typical in the beginning and praised her for continuing to be patient and working so hard at breast feeding.  Maternal Data Formula Feeding for Exclusion: No Has patient been taught Hand Expression?: Yes(Can easily hand express colostrum) Does the patient have breastfeeding experience prior to this delivery?: No  Feeding Feeding Type: Breast Fed  LATCH Score Latch: Repeated attempts needed to sustain latch, nipple held in mouth throughout feeding, stimulation needed to elicit sucking reflex.  Audible Swallowing: A few with stimulation  Type of Nipple: Flat(Compressible)  Comfort (Breast/Nipple): Soft / non-tender  Hold (Positioning): Assistance needed to correctly position infant at breast and maintain latch.  LATCH Score: 6  Interventions Interventions: Breast feeding basics reviewed;Assisted with latch;Skin to skin;Breast massage;Reverse pressure;Breast compression;Adjust position;Support pillows;Position options  Lactation Tools Discussed/Used WIC Program: Yes   Consult Status Consult Status: Follow-up Follow-up type: Call as needed    Martha [name], Martha [name] [name], 10:47 PM

## 2018-05-20 LAB — CBC
HCT: 26.7 % — ABNORMAL LOW (ref 36.0–46.0)
Hemoglobin: 8.6 g/dL — ABNORMAL LOW (ref 12.0–15.0)
MCH: 27.2 pg (ref 26.0–34.0)
MCHC: 32.2 g/dL (ref 30.0–36.0)
MCV: 84.5 fL (ref 80.0–100.0)
PLATELETS: 238 10*3/uL (ref 150–400)
RBC: 3.16 MIL/uL — ABNORMAL LOW (ref 3.87–5.11)
RDW: 13.3 % (ref 11.5–15.5)
WBC: 13.4 10*3/uL — ABNORMAL HIGH (ref 4.0–10.5)
nRBC: 0 % (ref 0.0–0.2)

## 2018-05-20 LAB — RPR: RPR Ser Ql: NONREACTIVE

## 2018-05-20 NOTE — Progress Notes (Signed)
Discharge instructions provided.  Pt and sig other verbalize understanding of all instructions and follow-up care.  Pt discharged to home with infant at 1408 on 05/20/18 via wheelchair by volunteer. Reynold BowenSusan Paisley Kaysee Hergert, RN 05/20/2018 2:39 PM

## 2018-05-20 NOTE — Plan of Care (Signed)
Patient's vital signs stable; fundus firm; small amount rubra lochia; voiding; good appetite; good po fluids; breastfeeding with good technique observed; good maternal-infant bonding observed; husband at bedside and attentive; parents watched period of Purple Cry DVD; copy given.

## 2018-05-20 NOTE — Anesthesia Postprocedure Evaluation (Signed)
Anesthesia Post Note  Patient: Martha [name]  Procedure(s) Performed: AN AD HOC LABOR EPIDURAL  Patient location during evaluation: Mother Baby Anesthesia Type: Epidural Level of consciousness: awake and alert Pain management: pain level controlled Vital Signs Assessment: post-procedure vital signs reviewed and stable Respiratory status: spontaneous breathing, nonlabored ventilation and respiratory function stable Cardiovascular status: stable Postop Assessment: no headache, no backache and epidural receding Anesthetic complications: no     Last Vitals:  Vitals:   05/19/18 1946 05/20/18 0034  BP: 116/77 113/72  Pulse: 78 (!) 102  Resp: 20 20  Temp: 36.4 C 36.8 C  SpO2: 100% 98%    Last Pain:  Vitals:   05/20/18 0614  TempSrc:   PainSc: 0-No pain                 Huxton Glaus,  Alessandra BevelsJennifer M

## 2018-05-20 NOTE — Progress Notes (Signed)
Pt states that she received TDaP and Influenza vaccines during this pregnancy and flu season.  Pt declines vaccines at this time. Reynold BowenSusan Paisley Kristiann Noyce, RN 05/20/2018 3:00 PM

## 2018-05-20 NOTE — Discharge Instructions (Signed)
Call your doctor for increased pain or vaginal bleeding, temperature above 100.4, depression, or concerns.  Increase calories and fluids while breastfeeding.  Continue prenatal vitamin and iron.  No strenuous activity or heavy lifting for 6 weeks. No intercourse, tampons, or douching for 6 weeks.  No tub baths- showers only.  No driving for 2 weeks or while taking pain medications.   °

## 2018-05-21 ENCOUNTER — Encounter: Payer: Medicaid Other | Admitting: Certified Nurse Midwife

## 2018-05-24 ENCOUNTER — Ambulatory Visit: Payer: Self-pay

## 2018-05-24 NOTE — Lactation Note (Signed)
This note was copied from a baby's chart. Lactation Consultation Note  Patient Name: Martha [name] ZOXWR'UToday's Date: 05/24/2018     Maternal Data    Feeding    LATCH Score                   Interventions    Lactation Tools Discussed/Used     Consult Status  Infant and mother seen today for a lactation outpatient consult. Mother complains of pain during breastfeeding. Pre and post weight was obtained before and after breastfeeding session. During the feeding, LC noticed that infant curls his bottom lip while breastfeeding causing mother pain. [facility] taught [facility] how to correct infant's position with pillows and make sure that infant's ear and hips are parallel when breastfeeding. Mother states that she felt no pain during this breastfeeding session utilizing these tips. Infant took in 66 mL during this breastfeeding session. LC reassured mother that this is a normal amount for infant's age. Mother states that she will continue to use the techniques taught at today's consult and denies further concerns at this time.    [name] 05/24/2018, 5:53 PM

## 2018-06-12 ENCOUNTER — Telehealth: Payer: Self-pay

## 2018-06-12 ENCOUNTER — Encounter: Payer: Self-pay | Admitting: Obstetrics and Gynecology

## 2018-06-12 ENCOUNTER — Ambulatory Visit (INDEPENDENT_AMBULATORY_CARE_PROVIDER_SITE_OTHER): Payer: Medicaid Other | Admitting: Obstetrics and Gynecology

## 2018-06-12 VITALS — BP 111/67 | HR 76 | Ht 64.0 in | Wt 172.0 lb

## 2018-06-12 DIAGNOSIS — B9689 Other specified bacterial agents as the cause of diseases classified elsewhere: Secondary | ICD-10-CM | POA: Diagnosis not present

## 2018-06-12 DIAGNOSIS — N760 Acute vaginitis: Secondary | ICD-10-CM

## 2018-06-12 MED ORDER — METRONIDAZOLE 0.75 % VA GEL: 1.0000 | Freq: Every day | VAGINAL | 0 refills | Status: AC

## 2018-06-12 NOTE — Telephone Encounter (Signed)
Mirena Insert / Steabler on 1/22 8:30 [place]

## 2018-06-12 NOTE — Progress Notes (Signed)
Obstetrics & Gynecology Office Visit   Chief Complaint:  Chief Complaint  Patient presents with  . Vaginitis    discharge w/odor x1 week    History of Present Illness: [name] is a 23 y.o. G2P1011 who LMP was No LMP recorded., presents today for a problem visit.   Patient complains of an abnormal vaginal discharge for 1 weeks. Discharge described as: bloody, thin and malodorous. Vaginal symptoms include none.   Other associated symptoms: none.Menstrual pattern: She had been bleeding intermittently since delivery.  She denies recent antibiotic exposure, denies changes in soaps, detergents coinciding with the onset of her symptoms.  She has not previously self treated or been under treatment by another provider for these symptoms.   Review of Systems: Review of Systems  Constitutional: Negative.   Gastrointestinal: Negative.   Genitourinary: Negative.   Skin: Negative.      Past Medical History:  Past Medical History:  Diagnosis Date  . Adjustment disorder with mixed anxiety and depressed mood   . Bipolar 1 disorder (HCC)   . Former smoker    stopped smoking 08/2017; did smoke 3/4 PPD  . History of substance use    cocaine and MJ    Past Surgical History:  Past Surgical History:  Procedure Laterality Date  . NO PAST SURGERIES      Gynecologic History: No LMP recorded.  Obstetric History: G2P1011  Family History:  Family History  Problem Relation Age of Onset  . COPD Maternal Grandmother   . Hypertension Maternal Grandmother   . Diabetes Maternal Grandfather   . Hypertension Maternal Grandfather   . Heart attack Maternal Grandfather        stents placed  . Endometriosis Mother   . Mental retardation Brother   . Gestational diabetes Maternal Aunt   . Autism Cousin        second cousin    Social History:  Social History   Socioeconomic History  . Marital status: Significant Other    Spouse name: Jonny RuizJohn is FOB  . Number of children: 0  .  Years of education: 1111  . Highest education level: Not on file  Occupational History  . Occupation: homemaker  Social Needs  . Financial resource strain: Not on file  . Food insecurity:    Worry: Not on file    Inability: Not on file  . Transportation needs:    Medical: Not on file    Non-medical: Not on file  Tobacco Use  . Smoking status: Former Smoker    Packs/day: 0.25    Types: Cigarettes  . Smokeless tobacco: Never Used  Substance and Sexual Activity  . Alcohol use: Not Currently  . Drug use: Not Currently    Types: Marijuana    Comment: 2-3 weeks ago  . Sexual activity: Yes  Lifestyle  . Physical activity:    Days per week: Not on file    Minutes per session: Not on file  . Stress: Not on file  Relationships  . Social connections:    Talks on phone: Not on file    Gets together: Not on file    Attends religious service: Not on file    Active member of club or organization: Not on file    Attends meetings of clubs or organizations: Not on file    Relationship status: Not on file  . Intimate partner violence:    Fear of current or ex partner: Not on file  Emotionally abused: Not on file    Physically abused: Not on file    Forced sexual activity: Not on file  Other Topics Concern  . Not on file  Social History Narrative  . Not on file    Allergies:  No Known Allergies  Medications: Prior to Admission medications   Medication Sig Start Date End Date Taking? Authorizing Provider  metroNIDAZOLE (METROGEL) 0.75 % vaginal gel Place 1 Applicatorful vaginally at bedtime for 5 days. 06/12/18 06/17/18  Vena Austria, MD    Physical Exam Vitals:  Vitals:   06/12/18 1144  BP: 111/67  Pulse: 76   No LMP recorded.  General: NAD HEENT: normocephalic, anicteric Thyroid: no enlargement, no palpable nodules Pulmonary: No increased work of breathing Cardiovascular: RRR, distal pulses 2+ Abdomen: NABS, soft, non-tender, non-distended.  Umbilicus without  lesions.  No hepatomegaly, splenomegaly or masses palpable. No evidence of hernia  Genitourinary:  External: Normal external female genitalia.  Normal urethral meatus, normal  Bartholin's and Skene's glands.    Vagina: Normal vaginal mucosa, no evidence of prolapse.    Cervix: Grossly normal in appearance, no bleeding  Uterus: Non-enlarged, mobile, normal contour.  No CMT  Adnexa: ovaries non-enlarged, no adnexal masses  Rectal: deferred  Lymphatic: no evidence of inguinal lymphadenopathy Extremities: no edema, erythema, or tenderness Neurologic: Grossly intact Psychiatric: mood appropriate, affect full  Female chaperone present for pelvic  portions of the physical exam  Wet Prep: PH: N/A given presence of bleeding Clue Cells: Positive Fungal elements: Negative Trichomonas: Negative  Amsel's Criteria Bacterial Vaginosis Clinical diagnosis required presence of 3 of the below 4:  1) Homogenous thin white discharge N/A still having lochia 2) Presence of clue cells present 3) Vaginal pH >4.5 N/A because of vaignal bleeding 4) Positive whiff test present   Assessment: 23 y.o. [id number] presenting with bacterial vaginosis  Plan: Problem List Items Addressed This Visit    None    Visit Diagnoses    Bacterial vaginosis    -  Primary     1) Risk factors for bacterial vaginosis and candida infections discussed.  We discussed normal vaginal flora/microbiome.  Any factors that may alter the microbiome increase the risk of these opportunistic infections.  These include changes in pH, antibiotic exposures, diabetes, wet bathing suits etc.  We discussed that treatment is aimed at eradicating abnormal bacterial overgrowth and or yeast.  There may be some role for vaginal probiotics in restoring normal vaginal flora.   - Rx metrogel given in the first month postpartum  2) Return in about 2 weeks (around 06/26/2018) for 6 week postparum and Mirena IUD insertion ([place]).   Vena Austria,  MD, [facility], [facility]

## 2018-06-26 ENCOUNTER — Encounter: Payer: Self-pay | Admitting: Obstetrics and Gynecology

## 2018-06-26 ENCOUNTER — Ambulatory Visit (INDEPENDENT_AMBULATORY_CARE_PROVIDER_SITE_OTHER): Payer: Medicaid Other | Admitting: Obstetrics and Gynecology

## 2018-06-26 DIAGNOSIS — Z1389 Encounter for screening for other disorder: Secondary | ICD-10-CM | POA: Diagnosis not present

## 2018-06-26 DIAGNOSIS — Z30430 Encounter for insertion of intrauterine contraceptive device: Secondary | ICD-10-CM | POA: Diagnosis not present

## 2018-06-26 NOTE — Progress Notes (Signed)
Postpartum Visit  Chief Complaint:  Chief Complaint  Patient presents with  . Postpartum Care    History of Present Illness: Patient is a 23 y.o. G2P1011 presents for postpartum visit.  Date of delivery: 05/19/2018 Type of delivery: Vaginal delivery - Vacuum or forceps assisted  no Episiotomy No.  Laceration: no  Pregnancy or labor problems:  no Any problems since the delivery:  no  Newborn Details:  SINGLETON :  1. BabyGender female. Birth weight: 7lbs 15oz Maternal Details:  Breast or formula feeding: plans to breastfeed Any bowel or bladder issues: No  Post partum depression/anxiety noted:  no Edinburgh Post-Partum Depression Score:3 Date of last PAP: 08/01/2017 no abnormalities   Review of Systems: ROS  The following portions of the patient's history were reviewed and updated as appropriate: allergies, current medications, past family history, past medical history, past social history, past surgical history and problem list.  Past Medical History:  Past Medical History:  Diagnosis Date  . Adjustment disorder with mixed anxiety and depressed mood   . Bipolar 1 disorder (HCC)   . Former smoker    stopped smoking 08/2017; did smoke 3/4 PPD  . History of substance use    cocaine and MJ    Past Surgical History:  Past Surgical History:  Procedure Laterality Date  . NO PAST SURGERIES      Family History:  Family History  Problem Relation Age of Onset  . COPD Maternal Grandmother   . Hypertension Maternal Grandmother   . Diabetes Maternal Grandfather   . Hypertension Maternal Grandfather   . Heart attack Maternal Grandfather        stents placed  . Endometriosis Mother   . Mental retardation Brother   . Gestational diabetes Maternal Aunt   . Autism Cousin        second cousin    Social History:  Social History   Socioeconomic History  . Marital status: Significant Other    Spouse name: Jonny Ruiz is FOB  . Number of children: 0  . Years of education:  29  . Highest education level: Not on file  Occupational History  . Occupation: homemaker  Social Needs  . Financial resource strain: Not on file  . Food insecurity:    Worry: Not on file    Inability: Not on file  . Transportation needs:    Medical: Not on file    Non-medical: Not on file  Tobacco Use  . Smoking status: Former Smoker    Packs/day: 0.25    Types: Cigarettes  . Smokeless tobacco: Never Used  Substance and Sexual Activity  . Alcohol use: Not Currently  . Drug use: Not Currently    Types: Marijuana    Comment: 2-3 weeks ago  . Sexual activity: Yes  Lifestyle  . Physical activity:    Days per week: Not on file    Minutes per session: Not on file  . Stress: Not on file  Relationships  . Social connections:    Talks on phone: Not on file    Gets together: Not on file    Attends religious service: Not on file    Active member of club or organization: Not on file    Attends meetings of clubs or organizations: Not on file    Relationship status: Not on file  . Intimate partner violence:    Fear of current or ex partner: Not on file    Emotionally abused: Not on file  Physically abused: Not on file    Forced sexual activity: Not on file  Other Topics Concern  . Not on file  Social History Narrative  . Not on file    Allergies:  No Known Allergies  Medications: Prior to Admission medications   Not on File    Physical Exam Blood pressure 114/72, pulse 62, height 5\' 4"  (1.626 m), weight 170 lb (77.1 kg), currently breastfeeding.    General: NAD HEENT: normocephalic, anicteric Pulmonary: No increased work of breathing Abdomen: NABS, soft, non-tender, non-distended.  Umbilicus without lesions.  No hepatomegaly, splenomegaly or masses palpable. No evidence of hernia. Genitourinary:  External: Normal external female genitalia.  Normal urethral meatus, normal  Bartholin's and Skene's glands.    Vagina: Normal vaginal mucosa, no evidence of prolapse.      Cervix: Grossly normal in appearance, no bleeding  Uterus: Non-enlarged, mobile, normal contour.  No CMT  Adnexa: ovaries non-enlarged, no adnexal masses  Rectal: deferred Extremities: no edema, erythema, or tenderness Neurologic: Grossly intact Psychiatric: mood appropriate, affect full  Edinburgh Postnatal Depression Scale - 06/26/18 0841      Edinburgh Postnatal Depression Scale:  In the Past 7 Days   I have been able to laugh and see the funny side of things.  0    I have looked forward with enjoyment to things.  0    I have blamed myself unnecessarily when things went wrong.  1    I have been anxious or worried for no good reason.  0    I have felt scared or panicky for no good reason.  0    Things have been getting on top of me.  1    I have been so unhappy that I have had difficulty sleeping.  0    I have felt sad or miserable.  0    I have been so unhappy that I have been crying.  1    The thought of harming myself has occurred to me.  0    Inocente SallesEdinburgh Postnatal Depression Scale Total  3       GYNECOLOGY OFFICE PROCEDURE NOTE  Jerelene Reddenicole Lynn Henson is a 23 y.o. G2P1011 here for a Mirena IUD insertion. No GYN concerns.  Last pap smear was on 2019 and was normal.  The patient is currently using postpartum state and abstinance for contraception and her LMP is No LMP recorded..  The indication for her IUD is contraception/cycle control.  IUD Insertion Procedure Note Patient identified, informed consent performed, consent signed.   Discussed risks of irregular bleeding, cramping, infection, malpositioning, expulsion or uterine perforation of the IUD (1:1000 placements)  which may require further procedure such as laparoscopy.  IUD while effective at preventing pregnancy do not prevent transmission of sexually transmitted diseases and use of barrier methods for this purpose was discussed. Time out was performed.  Urine pregnancy test negative.  Speculum placed in the vagina.  Cervix  visualized.  Cleaned with Betadine x 2.  Grasped anteriorly with a single tooth tenaculum.  Uterus sounded to 7.5 cm. IUD placed per manufacturer's recommendations.  Strings trimmed to 3 cm. Tenaculum was removed, good hemostasis noted.  Patient tolerated procedure well.   Patient was given post-procedure instructions.  She was advised to have backup contraception for one week.  Patient was also asked to check IUD strings periodically and follow up in 6 weeks for IUD check.  Assessment: 23 y.o. G2P1011 presenting for 6 week postpartum visit  Plan: Problem List  Items Addressed This Visit    None    Visit Diagnoses    6 weeks postpartum follow-up    -  Primary   Encounter for IUD insertion           1) Contraception - Education given regarding options for contraception, as well as compatibility with breast feeding if applicable.  Patient plans on IUD for contraception.  2)  Pap - ASCCP guidelines and rational discussed.  ASCCP guidelines and rational discussed.  Patient opts for every 3 years screening interval  3) Patient underwent screening for postpartum depression with no signs of depression  4) Return in about 6 weeks (around 08/07/2018) for IUD string check.   Vena Austria, MD, [facility], [facility] 06/26/2018, 9:14 AM

## 2018-08-07 ENCOUNTER — Ambulatory Visit: Payer: Medicaid Other | Admitting: Obstetrics and Gynecology

## 2018-08-14 ENCOUNTER — Other Ambulatory Visit: Payer: Self-pay

## 2018-08-14 ENCOUNTER — Encounter: Payer: Self-pay | Admitting: Obstetrics and Gynecology

## 2018-08-14 ENCOUNTER — Ambulatory Visit (INDEPENDENT_AMBULATORY_CARE_PROVIDER_SITE_OTHER): Payer: Medicaid Other | Admitting: Obstetrics and Gynecology

## 2018-08-14 VITALS — BP 125/67 | HR 55 | Wt 169.0 lb

## 2018-08-14 DIAGNOSIS — Z30431 Encounter for routine checking of intrauterine contraceptive device: Secondary | ICD-10-CM | POA: Diagnosis not present

## 2018-08-14 NOTE — Progress Notes (Signed)
       IUD String Check  Subjctive: [name] presents for IUD string check.  She had a Mirena placed 6 week ago.  Since placement of her IUD she stopped vaginal bleeding.  She reports cramping or discomfort about 2 weeks ago.  She has had intercourse since placement.  She has checked the strings.  She denies any fever, chills, nausea, vomiting, or other complaints.    Objective: BP 125/67 (BP Location: Left Arm, Patient Position: Sitting, Cuff Size: Normal)   Pulse (!) 55   Wt 169 lb (76.7 kg)   BMI 29.01 kg/m  Gen:  NAD, A&Ox3 HEENT: normocephalic, anicteric Pulmonary: no increased work of breathing Pelvic: Normal appearing external female genitalia, normal vaginal epithelium, no abnormal discharge. Normal appearing cervix.  IUD strings visible and 3 cm in length similar to at the time of placement. Psychiatric: mood appropriate, affect full Neurologic: grossly normal  Female chaperone was present for the entirety of the pelvic exam  Assessment: 23 y.o. year old female status post prior Mirena IUD placement 6 week ago, doing well.  Plan: 1.  The patient was given instructions to check her IUD strings monthly and call with any problems or concerns.  She should call for fevers, chills, abnormal vaginal discharge, pelvic pain, or other complaints. 2.  She will return for a annual exam in 1 year.  All questions answered.  Vena Austria, MD 08/14/2018 11:31 AM

## 2018-08-28 IMAGING — US US OB < 14 WEEKS - US OB TV
1 series · 14 of 28 positions shown · non-contrast
Comparison: Pelvic ultrasound 07/31/2017

CLINICAL DATA: Pregnant patient with abdominal pain.

EXAM:
OBSTETRIC <14 WK US AND TRANSVAGINAL OB US
TECHNIQUE: Both transabdominal and transvaginal ultrasound examinations were
performed for complete evaluation of the gestation as well as the
maternal uterus, adnexal regions, and pelvic cul-de-sac.
Transvaginal technique was performed to assess early pregnancy.

[Series 1: us ob < 14 weeks - us ob tv · 0.12mm/px · 14 of 94 slices shown]
[im 4/94]
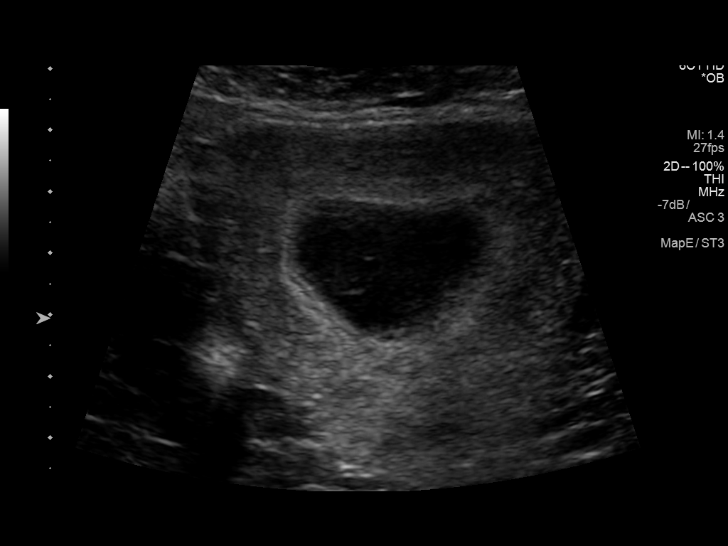
[im 11/94]
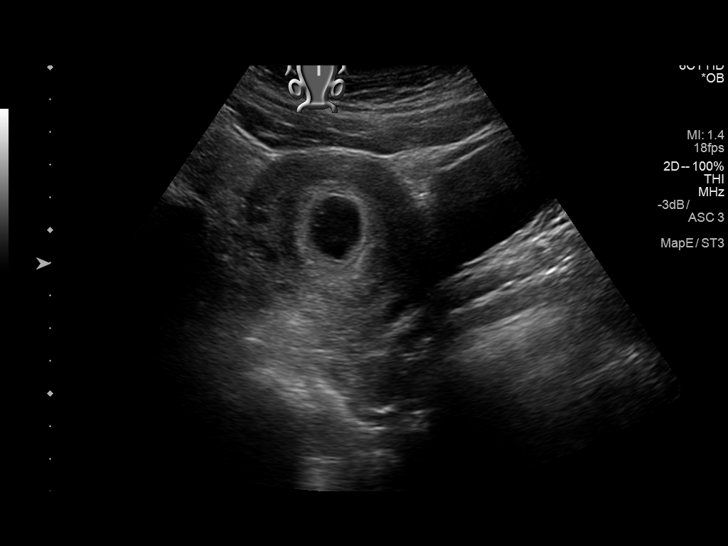
[im 18/94]
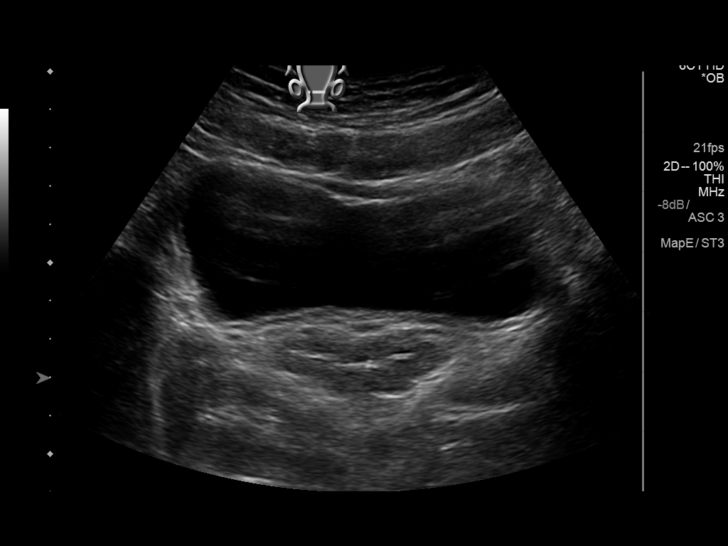
[im 25/94]
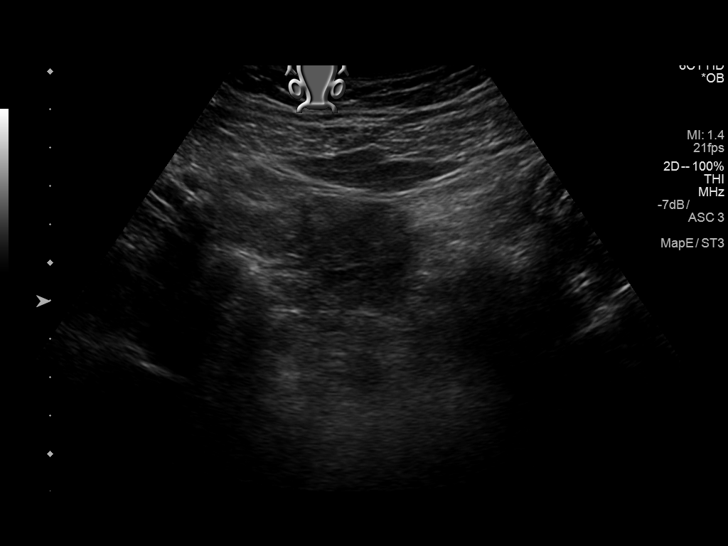
[im 32/94]
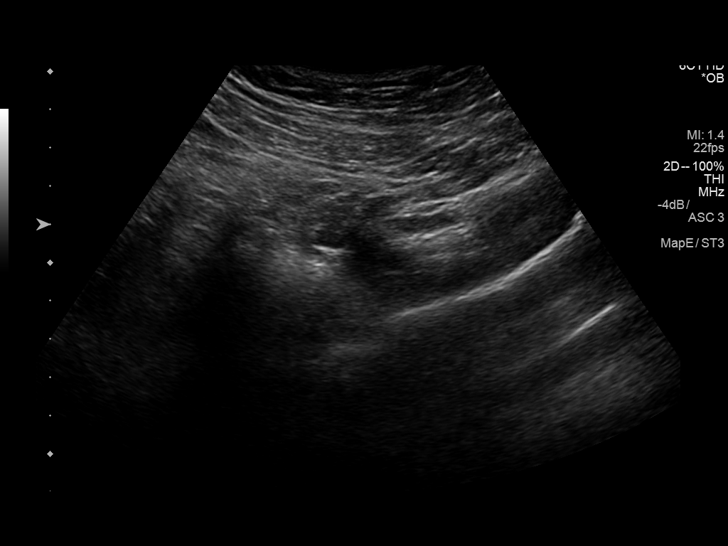
[im 38/94]
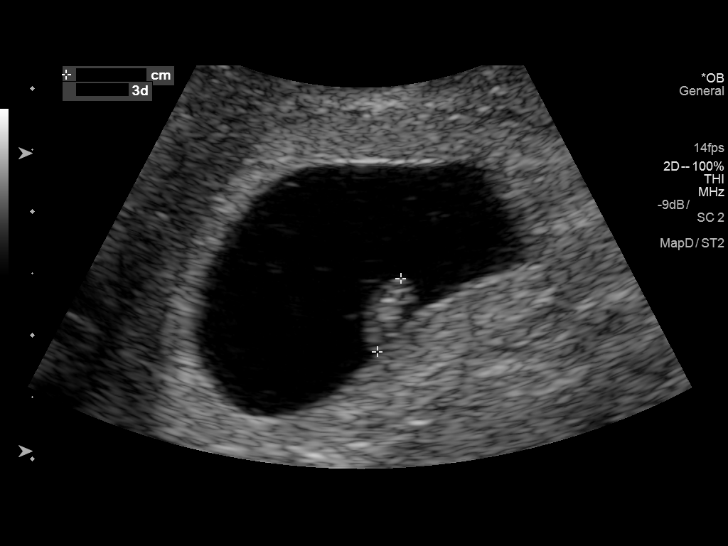
[im 45/94]
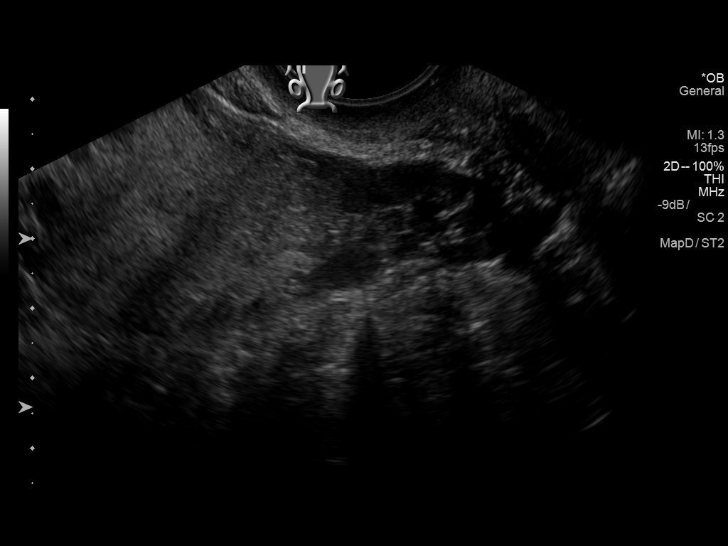
[im 52/94]
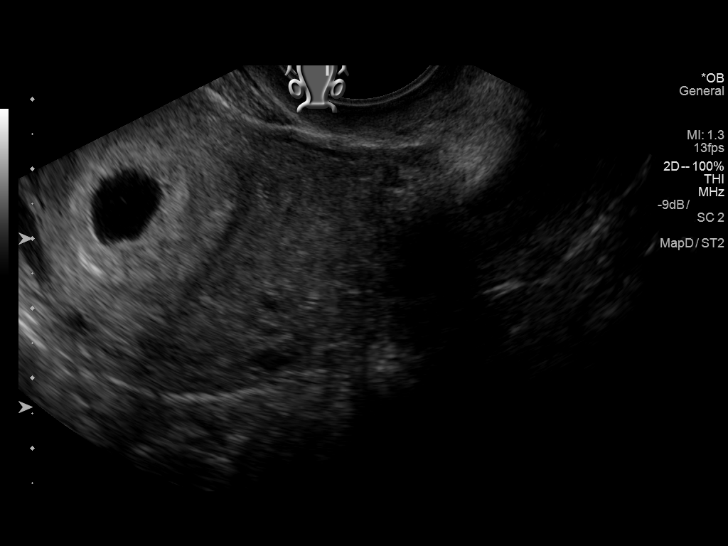
[im 59/94]
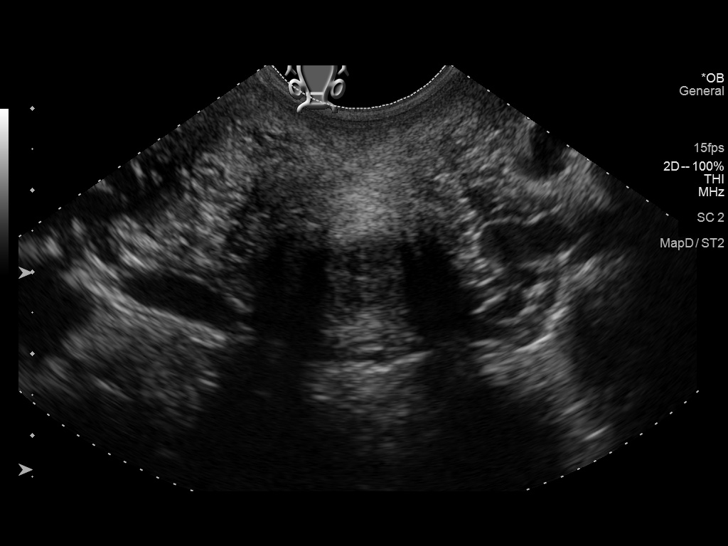
[im 66/94]
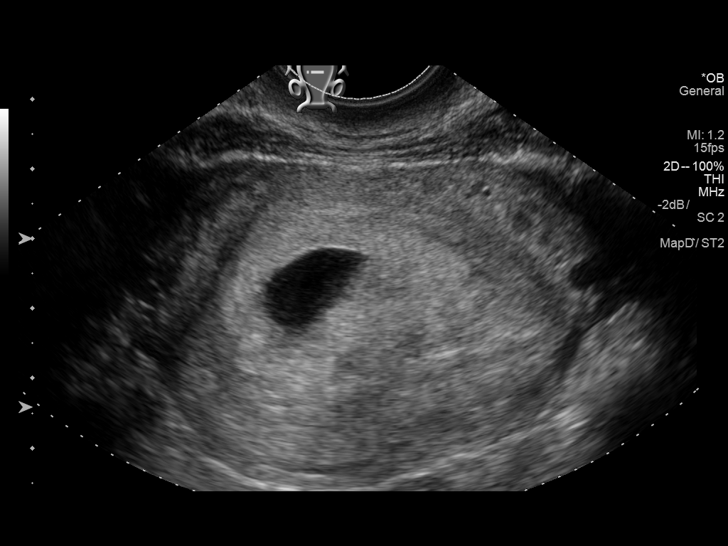
[im 73/94]
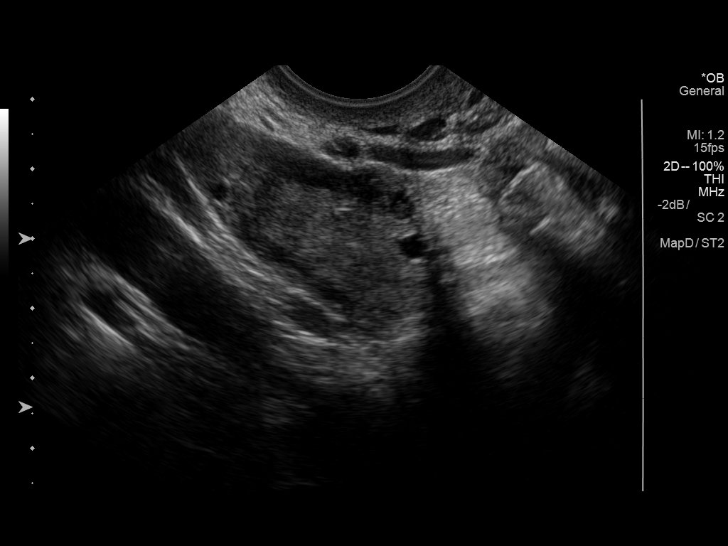
[im 80/94]
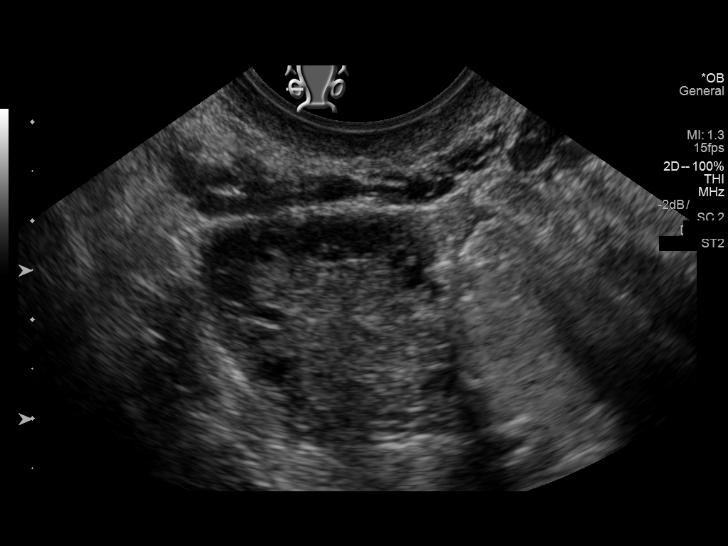
[im 87/94]
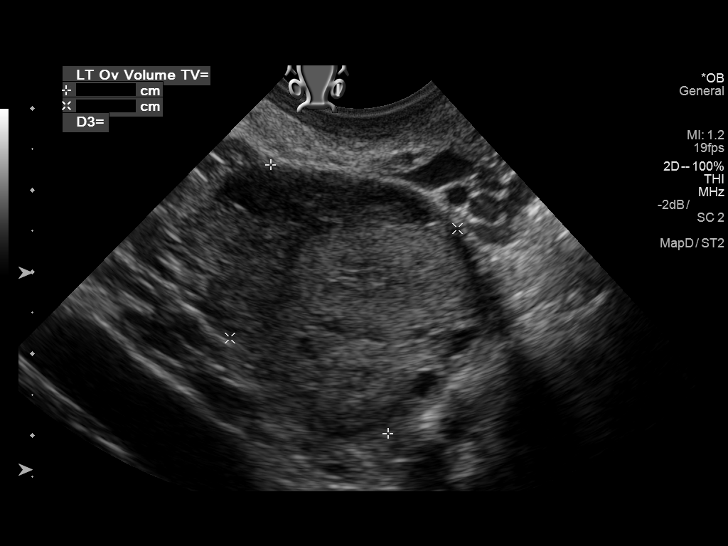
[im 94/94]
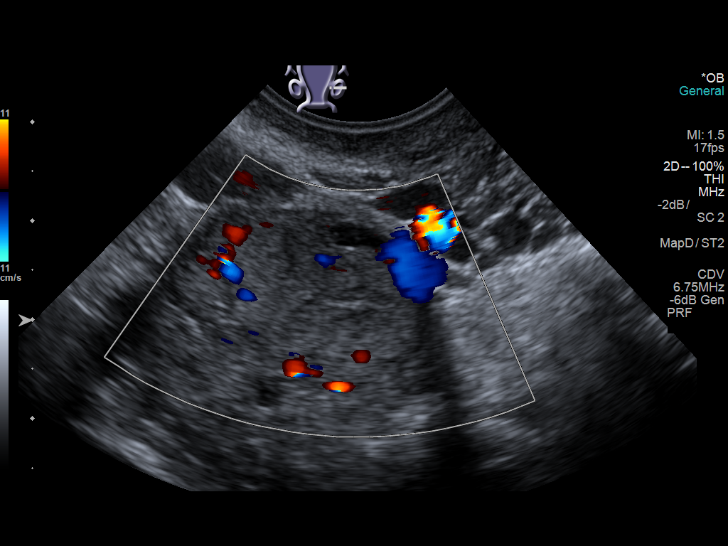

[14 of 28 positions shown; findings below may reference images not displayed]

FINDINGS: Intrauterine gestational sac: Single

Yolk sac:  Visualized.

Embryo:  Visualized.

Cardiac Activity: Visualized.

Heart Rate: 122 bpm

CRL:  6.3 mm   6 w   3 d                  US EDC: 05/17/2018

Subchorionic hemorrhage:  None visualized.

Maternal uterus/adnexae: Normal right ovary. Corpus luteum left
ovary. No free fluid in the pelvis.
IMPRESSION: Single live intrauterine gestation.  No subchorionic hemorrhage.

## 2018-11-20 IMAGING — US US MFM OB DETAIL+14 WK
2 series · 12 of 28 positions shown · non-contrast
Comparison: none

PATIENT INFO:

PERFORMED BY:
HANGULA
SERVICE(S) PROVIDED:
INDICATIONS:
18 weeks gestation of pregnancy
FETAL EVALUATION:
Num Of Fetuses:     1
Fetal Heart         141
Rate(bpm):
Presentation:       Vertex
Placenta:           Posterior  No previa
P. Cord Insertion:  Normal
Amniotic Fluid
AFI FV:      Within normal limits
AFI Sum(cm)     %Tile       Largest Pocket(cm)
12.3            38
BIOMETRY:
BPD:      42.4  mm     G. Age:  18w 6d         63  %    CI:        77.56   %    70 - 86
FL/HC:      18.6   %    16.1 -
HC:      152.4  mm     G. Age:  18w 2d         27  %    HC/AC:      1.19        1.09 -
AC:      127.7  mm     G. Age:  18w 3d         39  %    FL/BPD:     66.7   %
FL:       28.3  mm     G. Age:  18w 5d         48  %    FL/AC:      22.2   %    20 - 24
HUM:      26.3  mm     G. Age:  18w 2d         45  %
CER:      18.4  mm     G. Age:  18w 1d         37  %
NFT:       2.9  mm
CM:        4.9  mm
Est. FW:     242  gm      0 lb 9 oz     41  %
GESTATIONAL AGE:
LMP:           18w 4d        Date:  08/09/17                 EDD:   05/16/18
U/S Today:     18w 4d                                        EDD:   05/16/18
Best:          18w 4d     Det. By:  LMP  (08/09/17)          EDD:   05/16/18
ANATOMY:
Cranium:               Within Normal Limits   Aortic Arch:            Normal appearance
Cavum:                 CSP Visualized         Ductal Arch:            Normal appearance
Ventricles:            Normal appearance      Diaphragm:              Within Normal Limits
Choroid Plexus:        Within Normal Limits   Stomach:                Seen
Cerebellum:            Within Normal Limits   Abdomen:                Normal appearance
Posterior Fossa:       Within Normal Limits   Abdominal Wall:         Normal appearance
Nuchal Fold:           2.9 mm                 Cord Vessels:           Within Normal
Limits, 3 vessels
Face:                  Within Normal Limits   Kidneys:                Normal appearance
Lips:                  Normal appearance      Bladder:                Normal appearance
Thoracic:              Normal appearance      Spine:                  Normal appearance
Heart:                 Normal appearance      Upper Extremities:      Visualized
RVOT:                  Suboptimal             Lower Extremities:      Visualized
LVOT:                  Normal appearance
TARGETED ANATOMY:
Spine
Cervical:              Normal appearance      Lumbar:                 Normal appearance
Thoracic:              Normal appearance      Sacral:                 Normal appearance
Head/Neck
Orbits/Eyes:           Suboptimal
Thorax
Rt Outflow Tract:      Suboptimal             3 Vessel View:          Normal appearance
CERVIX UTERUS ADNEXA:
Cervix
Length:           4.31  cm.
Left Ovary
Size(cm)        4   x   1.8    x  3.3       Vol(ml):
Right Ovary
Size(cm)       3.3  x   1.8    x  3.5       Vol(ml):

[Series 1: us mfm ob detail+14 wk · 0.21mm/px · 82 acquisitions, 11 frames shown (1 of 2)]
[im 4/82]
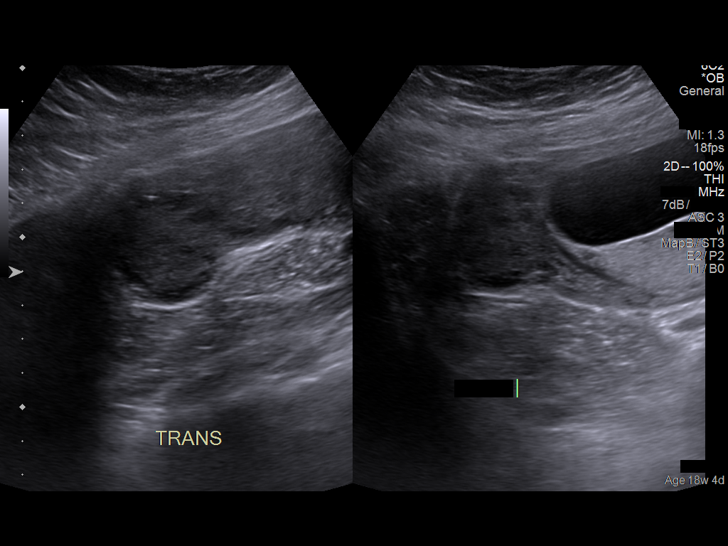
[im 10/82]
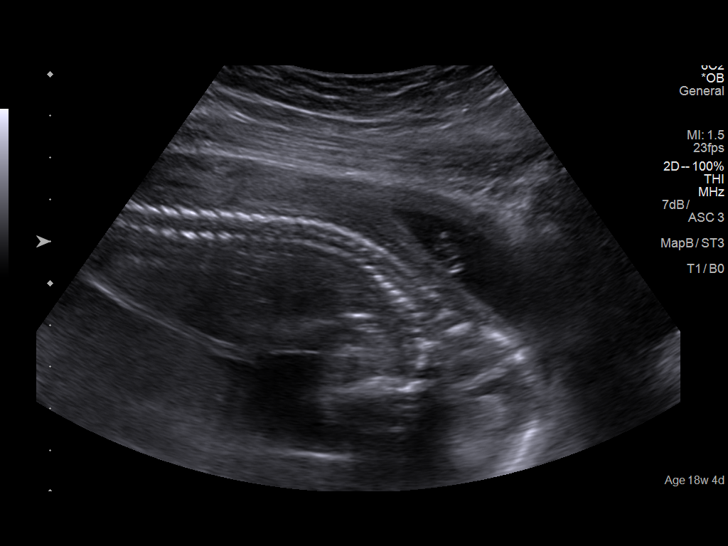
[im 17/82]
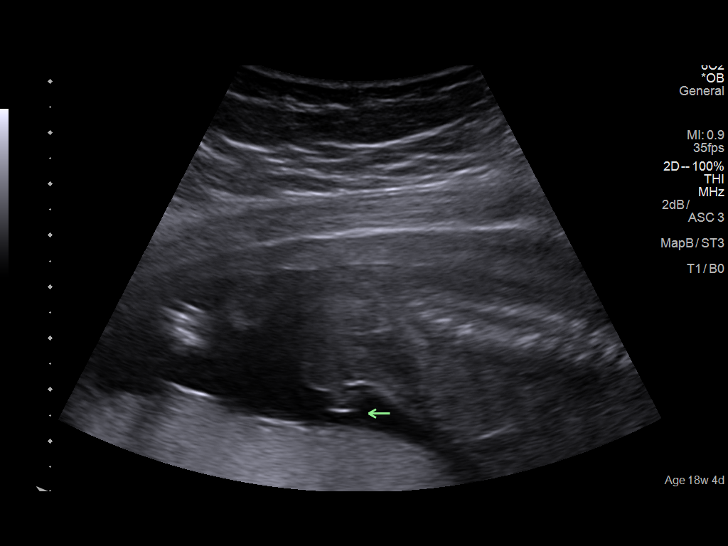
[im 26/82]
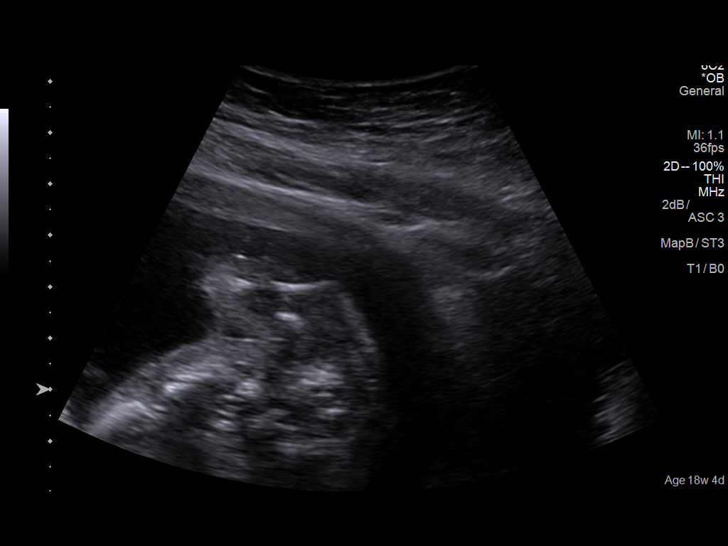
[im 33/82]
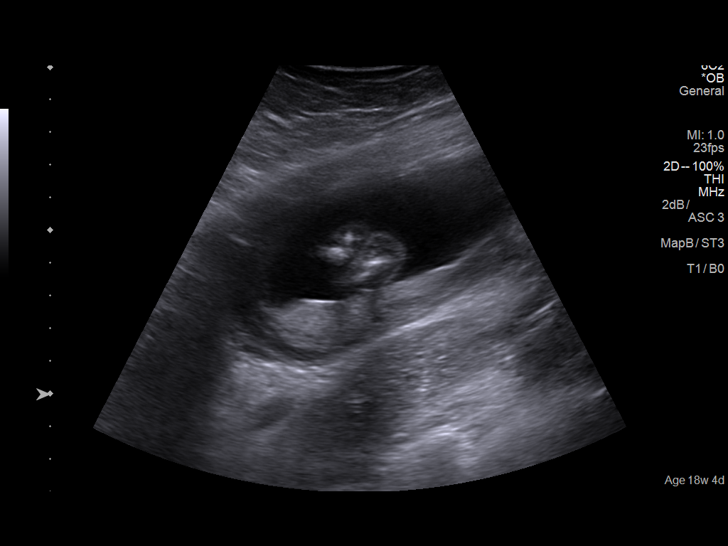
[im 39/82]
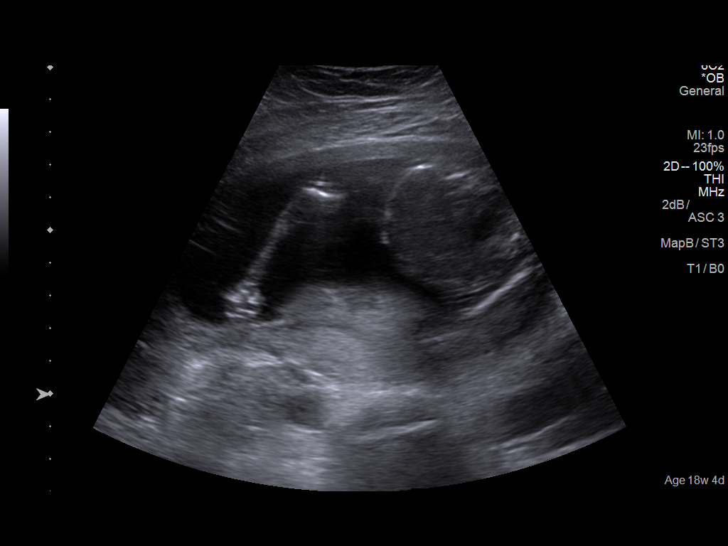
[im 49/82]
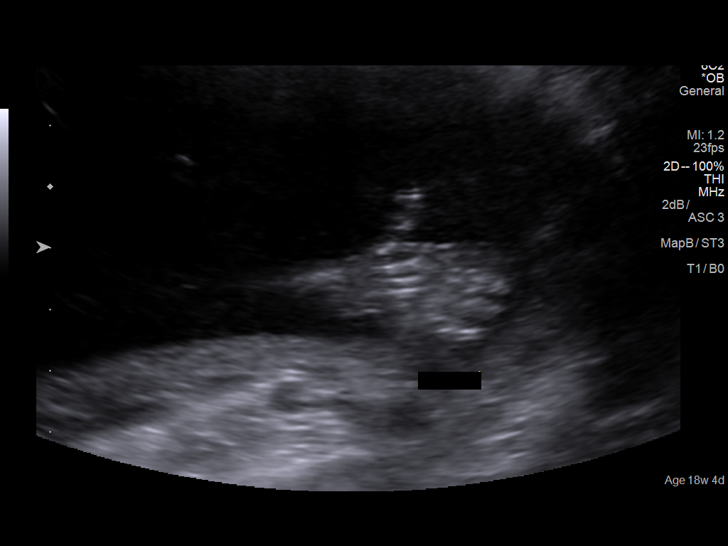
[im 56/82]
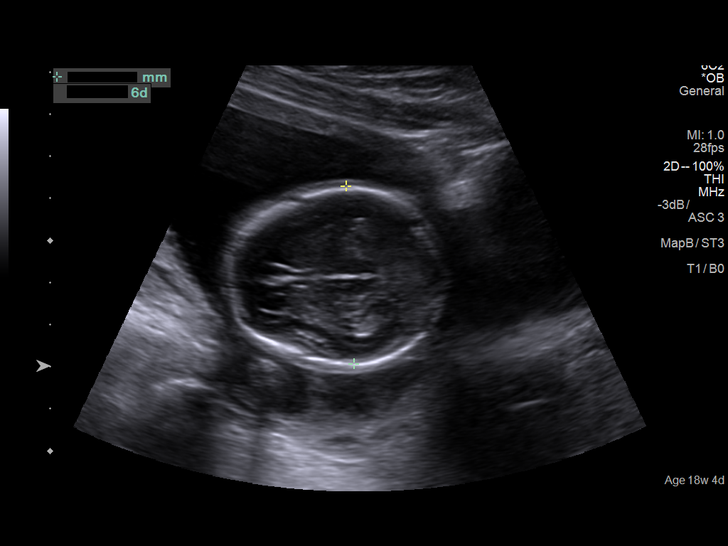
[im 62/82]
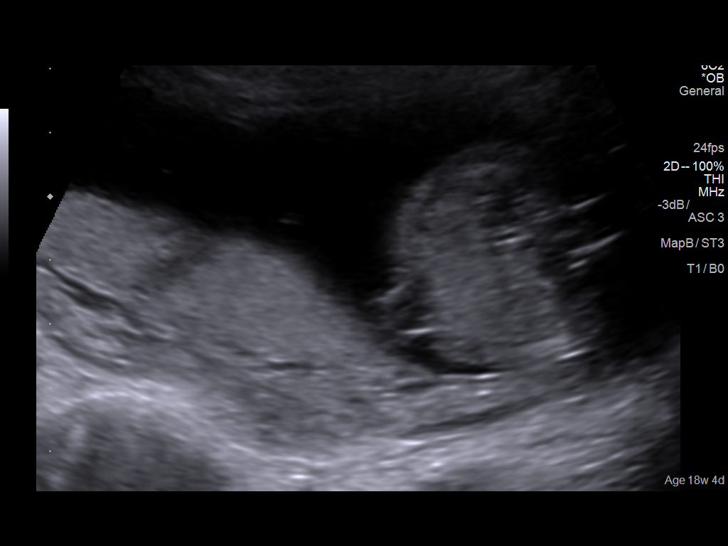
[im 72/82]
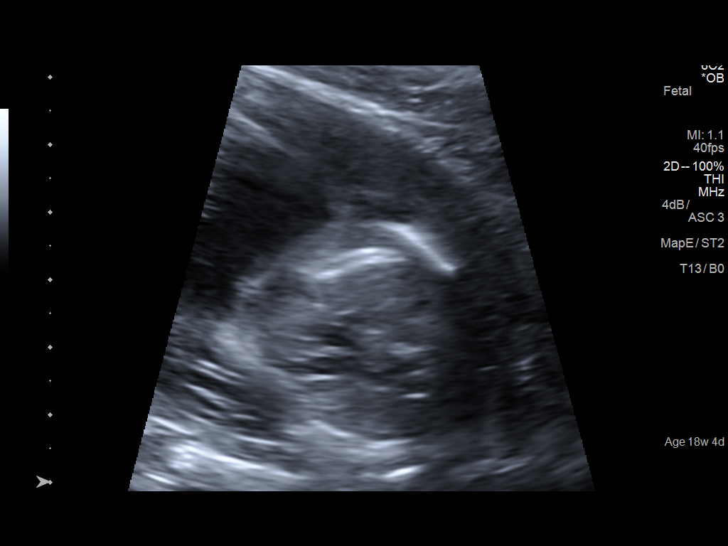
[im 78/82]
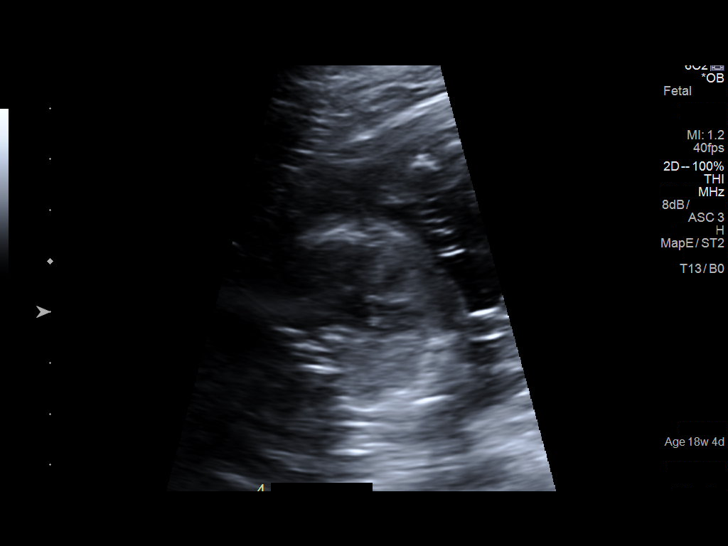

[Series 1001: us mfm ob detail+14 wk · 0.20mm/px · 1 of 8 slices shown (2 of 2)]
[im 1/8]
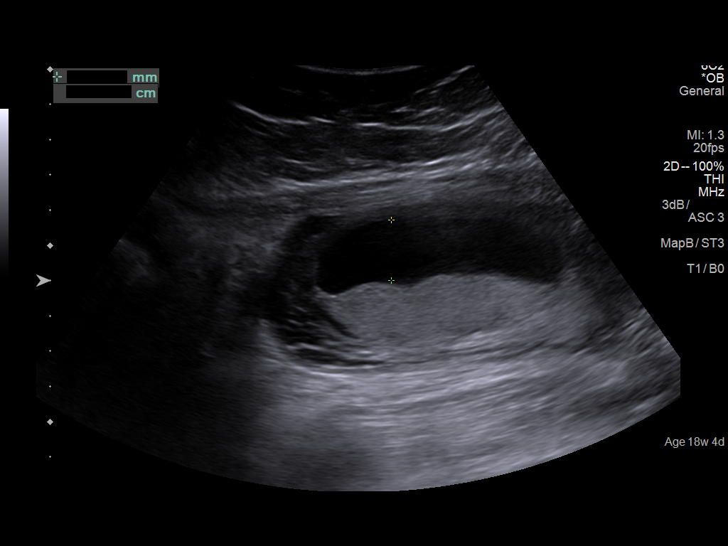

[12 of 28 positions shown; findings below may reference images not displayed]

IMPRESSION: Dear Dr.   HANGULA,

Thank you for referring your patient to [facility] for a fetal anatomical survey.  Ms.
Plus is currently 18 weeks and 4 days based on her LMP.
She was previously scanned with our group for first trimester
screening and the results from that screen were within normal
limits.

Today, Ms. Plus presents for detailed anatomic survey due
to history of tobacco use.

There is a singleton gestation with subjectively normal
amniotic fluid volume.

The fetal biometry correlates with established dating.

Detailed evaluation of the fetal anatomy was performed.  Due
to fetal position, the orbits and AO/PA view of the fetal heart
were suboptimally visualized. The rest of the detailed fetal
anatomical survey appears within normal limits.
It must be noted that a normal ultrasound cannot rule out
aneuploidy.

An additional scan was scheduled in 2 weeks to complete the
anatomic survey.

Thank you for allowing us to participate in your patient's care.

assistance.

## 2019-01-01 ENCOUNTER — Other Ambulatory Visit: Payer: Self-pay

## 2019-01-01 ENCOUNTER — Encounter: Payer: Self-pay | Admitting: Obstetrics and Gynecology

## 2019-01-01 ENCOUNTER — Ambulatory Visit (INDEPENDENT_AMBULATORY_CARE_PROVIDER_SITE_OTHER): Payer: Medicaid Other | Admitting: Obstetrics and Gynecology

## 2019-01-01 VITALS — BP 103/72 | Wt 170.0 lb

## 2019-01-01 DIAGNOSIS — R102 Pelvic and perineal pain: Secondary | ICD-10-CM | POA: Diagnosis not present

## 2019-01-01 DIAGNOSIS — Z30431 Encounter for routine checking of intrauterine contraceptive device: Secondary | ICD-10-CM | POA: Diagnosis not present

## 2019-01-01 NOTE — Progress Notes (Signed)
Obstetrics & Gynecology Office Visit   Chief Complaint:  Chief Complaint  Patient presents with  . Follow-up    IUD string check    History of Present Illness: 23 y.o. patient presenting for follow up of Mirena IUD placement 7 months ago.  The indication for her IUD was contraception.  She reportscomplications since her IUD placement.  Started having some intermittent cramping. Still having some occasional spotting.  is able to feel strings.    Review of Systems: Review of Systems  Constitutional: Negative.   Gastrointestinal: Positive for abdominal pain. Negative for nausea and vomiting.  Genitourinary: Negative.     Past Medical History:  Past Medical History:  Diagnosis Date  . Adjustment disorder with mixed anxiety and depressed mood   . Bipolar 1 disorder ([place])   . Former smoker    stopped smoking 08/2017; did smoke 3/4 PPD  . History of substance use    cocaine and MJ    Past Surgical History:  Past Surgical History:  Procedure Laterality Date  . NO PAST SURGERIES      Gynecologic History: No LMP recorded. (Menstrual status: IUD).  Obstetric History: G2P1011  Family History:  Family History  Problem Relation Age of Onset  . COPD Maternal Grandmother   . Hypertension Maternal Grandmother   . Diabetes Maternal Grandfather   . Hypertension Maternal Grandfather   . Heart attack Maternal Grandfather        stents placed  . Endometriosis Mother   . Mental retardation Brother   . Gestational diabetes Maternal Aunt   . Autism Cousin        second cousin    Social History:  Social History   Socioeconomic History  . Marital status: Significant Other    Spouse name: Jenny Reichmann is FOB  . Number of children: 0  . Years of education: 15  . Highest education level: Not on file  Occupational History  . Occupation: homemaker  Social Needs  . Financial resource strain: Not on file  . Food insecurity    Worry: Not on file    Inability: Not on file  .  Transportation needs    Medical: Not on file    Non-medical: Not on file  Tobacco Use  . Smoking status: Former Smoker    Packs/day: 0.25    Types: Cigarettes  . Smokeless tobacco: Never Used  Substance and Sexual Activity  . Alcohol use: Not Currently  . Drug use: Not Currently    Types: Marijuana    Comment: 2-3 weeks ago  . Sexual activity: Yes    Birth control/protection: I.U.D.  Lifestyle  . Physical activity    Days per week: Not on file    Minutes per session: Not on file  . Stress: Not on file  Relationships  . Social Herbalist on phone: Not on file    Gets together: Not on file    Attends religious service: Not on file    Active member of club or organization: Not on file    Attends meetings of clubs or organizations: Not on file    Relationship status: Not on file  . Intimate partner violence    Fear of current or ex partner: Not on file    Emotionally abused: Not on file    Physically abused: Not on file    Forced sexual activity: Not on file  Other Topics Concern  . Not on file  Social History Narrative  .  Not on file    Allergies:  No Known Allergies  Medications: Prior to Admission medications   Not on File    Physical Exam Blood pressure 103/72, weight 170 lb (77.1 kg), currently breastfeeding. No LMP recorded. (Menstrual status: IUD).  General: NAD, well nourished, appears stated age HEENT: normocephalic, anicteric Pulmonary: No increased work of breathing Genitourinary:  External: Normal external female genitalia.  Normal urethral meatus, normal Bartholin's and Skene's glands.    Vagina: Normal vaginal mucosa, no evidence of prolapse.    Cervix: Grossly normal in appearance, no bleeding, IUD strings visualized 2cm  Uterus: Non-enlarged, mobile, normal contour.  No CMT  Adnexa: ovaries non-enlarged, no adnexal masses  Rectal: deferred  Lymphatic: no evidence of inguinal lymphadenopathy Extremities: no edema, erythema, or  tenderness Neurologic: Grossly intact Psychiatric: mood appropriate, affect full  Female chaperone present for pelvic and breast  portions of the physical exam  Assessment: 23 y.o. G2P1011 IUD check Plan: Problem List Items Addressed This Visit    None    Visit Diagnoses    IUD check up    -  Primary   Relevant Orders   US Transvaginal Non-OB   Pelvic pain       Relevant Orders   US Transvaginal Non-OB       1.  The patient was given instructions to check her IUD strings monthly and call with any problems or concerns.  She should call for fevers, chills, abnormal vaginal discharge, pelvic pain, or other complaints.  2.   IUDs while effective at preventing pregnancy do not prevent transmission of sexually transmitted diseases and use of barrier methods for this purpose was discussed.  Low overall incidence of failure with 99.7% efficacy rate in typical use.  The patient has not contraindication to IUD placement.  3.  Discussed appears intrauterine however,in order to assess deployment within the uterine cavity TVUS warranted given cramping.  Will also assess adnexa  4) A total of 15 minutes were spent in face-to-face contact with the patient during this encounter with over half of that time devoted to counseling and coordination of care.  5) Return in about 1 week (around 01/08/2019) for GYN follow up and TVUS.   Vena AustriaAndreas Byron Peacock, MD, Evern CoreFACOG [facility], [facility] 01/01/2019, 9:05 AM

## 2019-01-14 ENCOUNTER — Ambulatory Visit: Payer: Medicaid Other | Admitting: Obstetrics and Gynecology

## 2019-01-14 ENCOUNTER — Other Ambulatory Visit: Payer: Medicaid Other

## 2019-01-24 ENCOUNTER — Other Ambulatory Visit: Payer: Self-pay

## 2019-01-24 ENCOUNTER — Encounter: Payer: Self-pay | Admitting: Obstetrics and Gynecology

## 2019-01-24 ENCOUNTER — Ambulatory Visit (INDEPENDENT_AMBULATORY_CARE_PROVIDER_SITE_OTHER): Payer: Medicaid Other | Admitting: Obstetrics and Gynecology

## 2019-01-24 ENCOUNTER — Ambulatory Visit (INDEPENDENT_AMBULATORY_CARE_PROVIDER_SITE_OTHER): Payer: Medicaid Other

## 2019-01-24 VITALS — BP 128/66 | Wt 174.0 lb

## 2019-01-24 DIAGNOSIS — R102 Pelvic and perineal pain: Secondary | ICD-10-CM

## 2019-01-24 DIAGNOSIS — Z30431 Encounter for routine checking of intrauterine contraceptive device: Secondary | ICD-10-CM

## 2019-01-24 DIAGNOSIS — N8312 Corpus luteum cyst of left ovary: Secondary | ICD-10-CM | POA: Diagnosis not present

## 2019-01-24 NOTE — Progress Notes (Signed)
Gynecology Ultrasound Follow Up  Chief Complaint:  Chief Complaint  Patient presents with  . Follow-up    GYN ultrasound     History of Present Illness: Patient is a 23 y.o. female who presents today for ultrasound evaluation of pelvic pain, IUD location .  Ultrasound demonstrates the following findgins Adnexa: normal adnexal structures Uterus: Uterus non-enlarged with normal endometrial stripe  Additional: IUD in proper location   Review of Systems: Review of Systems  Constitutional: Negative.   Genitourinary: Negative.   Skin: Negative.     Past Medical History:  Past Medical History:  Diagnosis Date  . Adjustment disorder with mixed anxiety and depressed mood   . Bipolar 1 disorder ([place])   . Former smoker    stopped smoking 08/2017; did smoke 3/4 PPD  . History of substance use    cocaine and MJ    Past Surgical History:  Past Surgical History:  Procedure Laterality Date  . NO PAST SURGERIES      Gynecologic History:  No LMP recorded. (Menstrual status: IUD). Contraception:IUD Last Pap: 08/01/2017. Results were: .no abnormalities  Family History:  Family History  Problem Relation Age of Onset  . COPD Maternal Grandmother   . Hypertension Maternal Grandmother   . Diabetes Maternal Grandfather   . Hypertension Maternal Grandfather   . Heart attack Maternal Grandfather        stents placed  . Endometriosis Mother   . Mental retardation Brother   . Gestational diabetes Maternal Aunt   . Autism Cousin        second cousin    Social History:  Social History   Socioeconomic History  . Marital status: Significant Other    Spouse name: Jenny Reichmann is FOB  . Number of children: 0  . Years of education: 44  . Highest education level: Not on file  Occupational History  . Occupation: homemaker  Social Needs  . Financial resource strain: Not on file  . Food insecurity    Worry: Not on file    Inability: Not on file  . Transportation needs    Medical: Not  on file    Non-medical: Not on file  Tobacco Use  . Smoking status: Former Smoker    Packs/day: 0.25    Types: Cigarettes  . Smokeless tobacco: Never Used  Substance and Sexual Activity  . Alcohol use: Not Currently  . Drug use: Not Currently    Types: Marijuana    Comment: 2-3 weeks ago  . Sexual activity: Yes    Birth control/protection: I.U.D.  Lifestyle  . Physical activity    Days per week: Not on file    Minutes per session: Not on file  . Stress: Not on file  Relationships  . Social Herbalist on phone: Not on file    Gets together: Not on file    Attends religious service: Not on file    Active member of club or organization: Not on file    Attends meetings of clubs or organizations: Not on file    Relationship status: Not on file  . Intimate partner violence    Fear of current or ex partner: Not on file    Emotionally abused: Not on file    Physically abused: Not on file    Forced sexual activity: Not on file  Other Topics Concern  . Not on file  Social History Narrative  . Not on file    Allergies:  No Known  Allergies  Medications: Prior to Admission medications   Not on File    Physical Exam Vitals: Blood pressure 128/66, weight 174 lb (78.9 kg), currently breastfeeding.  General: NAD HEENT: normocephalic, anicteric Pulmonary: No increased work of breathing Extremities: no edema, erythema, or tenderness Neurologic: Grossly intact, normal gait Psychiatric: mood appropriate, affect full  [place] Transvaginal Non-ob  Result Date: 01/24/2019 Patient Name: Martha [name] DOB: May 25, 1996 MRN: [id number] ULTRASOUND REPORT Location: [facility] Date of Service: 01/24/2019 Indications:Abnormal Uterine Bleeding Findings: The uterus is anteverted and measures 8.0 x 4.4 x 3.9 cm. Echo texture is homogenous without evidence of focal masses. The Endometrium measures 4.0 mm. The IUD  Is correctly located within the uterus. Right Ovary measures 4.0 x 3.8  x 3.3 cm. It is normal in appearance. Left Ovary measures 3.7 x 3.2 x 2.9 cm. It is normal in appearance. There is a corpus luteal cyst in the left ovary. Survey of the adnexa demonstrates no adnexal masses. There is no scant fluid in the cul de sac. Impression: 1. Normal pelvic ultrasound. 2. Correct placement of the IUD. Recommendations: 1.Clinical correlation with the patient's History and Physical Exam. Deanna ArtisElyse S Fairbanks, RT Images reviewed.  Normal GYN study without visualized pathology.  IUD in proper location and deployment within the endometrial cavity. Vena AustriaAndreas Caydence Enck, MD, Evern CoreFACOG [facility], [facility] 01/24/2019, 11:26 AM     Assessment: 23 y.o. G2P1011 follow up pelvic pain and IUD location Plan: Problem List Items Addressed This Visit    None    Visit Diagnoses    Pelvic pain in female    -  Primary   IUD check up          1) IUD in good location - opts to continue IUD for  for now.  No gyn pathology visualized on ultrasound today  2) A total of 15 minutes were spent in face-to-face contact with the patient during this encounter with over half of that time devoted to counseling and coordination of care.  3) Return in about 1 year (around 01/24/2020) for annual.    Vena AustriaAndreas Kolton Kienle, MD, Merlinda FrederickFACOG [facility], [facility] 01/24/2019, 11:32 AM

## 2019-03-31 ENCOUNTER — Other Ambulatory Visit: Payer: Self-pay

## 2019-03-31 DIAGNOSIS — Z20822 Contact with and (suspected) exposure to covid-19: Secondary | ICD-10-CM

## 2019-04-02 ENCOUNTER — Ambulatory Visit: Payer: Self-pay | Admitting: *Deleted

## 2019-04-02 LAB — NOVEL CORONAVIRUS, NAA: SARS-CoV-2, NAA: NOT DETECTED

## 2019-04-02 NOTE — Telephone Encounter (Signed)
Reviewed negative [phone] results with patient. She had questions regarding isolation and returning to work b/c she lives with mother-in-law who tested positive on 03/30/19. Patient then tested on 03/31/19. She works for a [employer] and is around patients. According to the time frame provided by the patient, it is possible she tested one to two days too early. She is not showing any symptoms. Recommended she remain in isolation and be retested tomorrow due to the possibility of tested too soon after exposure. Advised calling a pcp for prescription to be retested, no pcp. Recommended [email or web address] for another screening. Monitor for any symptoms. Stated she understood.

## 2019-04-02 NOTE — Telephone Encounter (Signed)
  Reason for Disposition . Health Information question, no triage required and triager able to answer question  Answer Assessment - Initial Assessment Questions 1. REASON FOR CALL or QUESTION: "What is your reason for calling today?" or "How can I best help you?" or "What question do you have that I can help answer?"     Did I test too early?  Protocols used: INFORMATION ONLY CALL-A-AH

## 2019-04-30 ENCOUNTER — Emergency Department
Admission: EM | Admit: 2019-04-30 | Discharge: 2019-04-30 | Disposition: A | Discharge status: Home / Self Care | Payer: Medicaid Other | Attending: Emergency Medicine | Admitting: Emergency Medicine

## 2019-04-30 ENCOUNTER — Other Ambulatory Visit: Payer: Self-pay

## 2019-04-30 ENCOUNTER — Encounter: Payer: Self-pay | Admitting: Emergency Medicine

## 2019-04-30 ENCOUNTER — Emergency Department: Payer: Medicaid Other

## 2019-04-30 DIAGNOSIS — F1210 Cannabis abuse, uncomplicated: Secondary | ICD-10-CM | POA: Insufficient documentation

## 2019-04-30 DIAGNOSIS — M542 Cervicalgia: Secondary | ICD-10-CM | POA: Insufficient documentation

## 2019-04-30 DIAGNOSIS — Y92410 Unspecified street and highway as the place of occurrence of the external cause: Secondary | ICD-10-CM | POA: Insufficient documentation

## 2019-04-30 DIAGNOSIS — Y999 Unspecified external cause status: Secondary | ICD-10-CM | POA: Diagnosis not present

## 2019-04-30 DIAGNOSIS — M549 Dorsalgia, unspecified: Secondary | ICD-10-CM | POA: Insufficient documentation

## 2019-04-30 DIAGNOSIS — Y939 Activity, unspecified: Secondary | ICD-10-CM | POA: Diagnosis not present

## 2019-04-30 DIAGNOSIS — Z87891 Personal history of nicotine dependence: Secondary | ICD-10-CM | POA: Diagnosis not present

## 2019-04-30 MED ORDER — ACETAMINOPHEN 325 MG PO TABS
650.0000 mg | ORAL_TABLET | Freq: Once | ORAL | Status: AC
  Administered 2019-04-30: 650 mg via ORAL
  Filled 2019-04-30: qty 2

## 2019-04-30 MED ORDER — METHOCARBAMOL 500 MG PO TABS: 500.0000 mg | ORAL_TABLET | Freq: Three times a day (TID) | ORAL | 0 refills | Status: AC | PRN

## 2019-04-30 MED ORDER — MELOXICAM 15 MG PO TABS: 15.0000 mg | ORAL_TABLET | Freq: Every day | ORAL | 1 refills | Status: AC
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
## 2019-08-05 ENCOUNTER — Telehealth: Payer: Self-pay

## 2019-08-05 NOTE — Telephone Encounter (Signed)
Spoke w/patient. She does feel the strings. Advised cervix may/can open slightly d/t hormonal changes. Advised to monitor for & report any signs/feelings of IUD in the cervical os/opening.

## 2019-08-05 NOTE — Telephone Encounter (Signed)
Patient had IUD placed a little over a year ago. She was checking for her strings and noticed that she is "open" and hadn't noticed that before. She is inquiring if this is normal. [phone]

## 2019-09-28 ENCOUNTER — Encounter: Payer: Self-pay | Admitting: Emergency Medicine

## 2019-09-28 ENCOUNTER — Emergency Department
Admission: EM | Admit: 2019-09-28 | Discharge: 2019-09-29 | Disposition: A | Discharge status: Home / Self Care | Payer: Medicaid Other | Attending: Emergency Medicine | Admitting: Emergency Medicine

## 2019-09-28 ENCOUNTER — Other Ambulatory Visit: Payer: Self-pay

## 2019-09-28 DIAGNOSIS — L237 Allergic contact dermatitis due to plants, except food: Secondary | ICD-10-CM

## 2019-09-28 DIAGNOSIS — Z3200 Encounter for pregnancy test, result unknown: Secondary | ICD-10-CM | POA: Diagnosis not present

## 2019-09-28 DIAGNOSIS — Z87891 Personal history of nicotine dependence: Secondary | ICD-10-CM | POA: Diagnosis not present

## 2019-09-28 DIAGNOSIS — F1210 Cannabis abuse, uncomplicated: Secondary | ICD-10-CM | POA: Insufficient documentation

## 2019-09-28 DIAGNOSIS — R21 Rash and other nonspecific skin eruption: Secondary | ICD-10-CM | POA: Diagnosis present

## 2019-09-28 MED ORDER — TRIAMCINOLONE ACETONIDE 0.1 % EX CREA: 1.0000 "application " | TOPICAL_CREAM | Freq: Four times a day (QID) | CUTANEOUS | 1 refills | Status: DC

## 2019-09-28 MED ORDER — TRIAMCINOLONE ACETONIDE 40 MG/ML IJ SUSP
40.0000 mg | Freq: Once | INTRAMUSCULAR | Status: AC
  Administered 2019-09-29: 40 mg via INTRAMUSCULAR
  Filled 2019-09-28: qty 1

## 2019-09-28 NOTE — ED Triage Notes (Signed)
Patient with a rash that she states is poison ivy for times one week. Patient states that the rash is to her arms and face.
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
## 2019-09-29 LAB — HCG, QUANTITATIVE, PREGNANCY: hCG, Beta Chain, Quant, S: <1 m[IU]/mL (ref <5)

## 2020-12-03 ENCOUNTER — Ambulatory Visit (INDEPENDENT_AMBULATORY_CARE_PROVIDER_SITE_OTHER): Payer: Medicaid Other | Admitting: Obstetrics and Gynecology

## 2020-12-03 ENCOUNTER — Other Ambulatory Visit: Payer: Self-pay

## 2020-12-03 ENCOUNTER — Encounter: Payer: Self-pay | Admitting: Obstetrics and Gynecology

## 2020-12-03 ENCOUNTER — Other Ambulatory Visit (HOSPITAL_COMMUNITY)
Admission: RE | Admit: 2020-12-03 | Discharge: 2020-12-03 | Disposition: A | Discharge status: Home / Self Care | Payer: Medicaid Other | Source: Ambulatory Visit | Attending: Obstetrics and Gynecology | Admitting: Obstetrics and Gynecology

## 2020-12-03 VITALS — BP 118/74 | HR 90 | Ht 63.0 in | Wt 194.0 lb

## 2020-12-03 DIAGNOSIS — T8332XA Displacement of intrauterine contraceptive device, initial encounter: Secondary | ICD-10-CM

## 2020-12-03 DIAGNOSIS — Z124 Encounter for screening for malignant neoplasm of cervix: Secondary | ICD-10-CM

## 2020-12-03 DIAGNOSIS — N926 Irregular menstruation, unspecified: Secondary | ICD-10-CM

## 2020-12-03 DIAGNOSIS — Z3009 Encounter for other general counseling and advice on contraception: Secondary | ICD-10-CM

## 2020-12-03 NOTE — Progress Notes (Signed)
Patient, No Pcp Per (Inactive)   Chief Complaint  Patient presents with   Gynecologic Exam    IUD fell out    HPI:      [name] is a 25 y.o. G2P1011 whose LMP was No LMP recorded (lmp unknown). (Menstrual status: IUD)., presents today for f/u after IUD fell out last night. Mirena placed 06/26/18 at [facility] visit. Has been amenorrheic, mild monthly cramping. Has been having cramping past few wks and noticed strings with wiping last night. On further inspection, found IUD to be in vagina and removed it (it's intact). Pt unsure how long it has been there. Wants to give it a few wks to make sure she's not pregnant and will then want BC again. Hx of migraines with aura. Did estrogen OCPs in past and probably wants pills again. She is sex active, occas with pain. Was sex active last night prior to finding IUD had expelled. Past due for pap smear.   Past Medical History:  Diagnosis Date   Adjustment disorder with mixed anxiety and depressed mood    Bipolar 1 disorder (HCC)    Former smoker    stopped smoking 08/2017; did smoke 3/4 PPD   History of substance use    cocaine and MJ   Migraine with aura     Past Surgical History:  Procedure Laterality Date   NO PAST SURGERIES      Family History  Problem Relation Age of Onset   COPD Maternal Grandmother    Hypertension Maternal Grandmother    Diabetes Maternal Grandfather    Hypertension Maternal Grandfather    Heart attack Maternal Grandfather        stents placed   Endometriosis Mother    Mental retardation Brother    Gestational diabetes Maternal Aunt    Autism Cousin        second cousin    Social History   Socioeconomic History   Marital status: Significant Other    Spouse name: Jonny Ruiz is FOB   Number of children: 0   Years of education: 11   Highest education level: Not on file  Occupational History   Occupation: homemaker  Tobacco Use   Smoking status: Former    Packs/day: 0.25    Pack years: 0.00     Types: Cigarettes   Smokeless tobacco: Never  Vaping Use   Vaping Use: Every day  Substance and Sexual Activity   Alcohol use: Yes    Comment: occ   Drug use: Not Currently    Types: Marijuana    Comment: 2-3 weeks ago   Sexual activity: Yes    Birth control/protection: I.U.D.  Other Topics Concern   Not on file  Social History Narrative   Not on file   Social Determinants of Health   Financial Resource Strain: Not on file  Food Insecurity: Not on file  Transportation Needs: Not on file  Physical Activity: Not on file  Stress: Not on file  Social Connections: Not on file  Intimate Partner Violence: Not on file    Outpatient Medications Prior to Visit  Medication Sig Dispense Refill   triamcinolone cream (KENALOG) 0.1 % Apply 1 application topically 4 (four) times daily. (Patient not taking: Reported on 12/03/2020) 30 g 1   No facility-administered medications prior to visit.    ROS:  Review of Systems  Constitutional:  Negative for fever.  Gastrointestinal:  Negative for blood in stool, constipation, diarrhea, nausea and vomiting.  Genitourinary:  Negative for dyspareunia, dysuria, flank pain, frequency, hematuria, urgency, vaginal bleeding, vaginal discharge and vaginal pain.  Musculoskeletal:  Negative for back pain.  Skin:  Negative for rash.  BREAST: No symptoms   OBJECTIVE:   Vitals:  BP 118/74 (Cuff Size: Normal)   Pulse 90   Ht 5\' 3"  (1.6 m)   Wt 194 lb (88 kg)   LMP  (LMP Unknown)   BMI 34.37 kg/m   Physical Exam Vitals reviewed.  Constitutional:      Appearance: She is well-developed.  Pulmonary:     Effort: Pulmonary effort is normal.  Genitourinary:    General: Normal vulva.     Pubic Area: No rash.      Labia:        Right: No rash, tenderness or lesion.        Left: No rash, tenderness or lesion.      Vagina: Normal. No vaginal discharge, erythema or tenderness.     Cervix: Normal.     Uterus: Normal. Tender. Not enlarged.       Adnexa: Right adnexa normal and left adnexa normal.       Right: No mass or tenderness.         Left: No mass or tenderness.    Musculoskeletal:        General: Normal range of motion.     Cervical back: Normal range of motion.  Skin:    General: Skin is warm and dry.  Neurological:     General: No focal deficit present.     Mental Status: She is alert and oriented to person, place, and time.  Psychiatric:        Mood and Affect: Mood normal.        Behavior: Behavior normal.        Thought Content: Thought content normal.        Judgment: Judgment normal.     Assessment/Plan: Intrauterine device (IUD) migration, initial encounter--IUD expelled and noted to be intact (pt brought with her). Check serum beta next wk because pt worried about possible conception. Last sex active last night. Will f/u with results.   Late menses - Plan: Beta hCG quant (ref lab)  Encounter for other general counseling or advice on contraception--prog only options discussed due to hx of migraines with aura. Pt to call with decision once serum beta results back. Most likely will want to try POPs.   Cervical cancer screening - Plan: Cytology - PAP,    Return in about 1 year (around 12/03/2021), or if symptoms worsen or fail to improve, for annual.  Milanna Kozlov B. Deloras Reichard, PA-C 12/03/2020 12:26 PM

## 2020-12-08 LAB — CYTOLOGY - PAP: Diagnosis: NEGATIVE

## 2020-12-15 ENCOUNTER — Other Ambulatory Visit: Payer: Self-pay

## 2020-12-15 ENCOUNTER — Other Ambulatory Visit: Payer: Medicaid Other

## 2020-12-15 DIAGNOSIS — N926 Irregular menstruation, unspecified: Secondary | ICD-10-CM

## 2020-12-16 LAB — BETA HCG QUANT (REF LAB): hCG Quant: <1 m[IU]/mL

## 2021-02-23 ENCOUNTER — Ambulatory Visit (INDEPENDENT_AMBULATORY_CARE_PROVIDER_SITE_OTHER): Payer: Medicaid Other | Admitting: Obstetrics and Gynecology

## 2021-02-23 ENCOUNTER — Other Ambulatory Visit: Payer: Self-pay

## 2021-02-23 ENCOUNTER — Encounter: Payer: Self-pay | Admitting: Obstetrics and Gynecology

## 2021-02-23 VITALS — BP 100/70 | Ht 64.0 in | Wt 192.0 lb

## 2021-02-23 DIAGNOSIS — Z30011 Encounter for initial prescription of contraceptive pills: Secondary | ICD-10-CM

## 2021-02-23 MED ORDER — NORETHINDRONE 0.35 MG PO TABS: 1.0000 | ORAL_TABLET | Freq: Every day | ORAL | 2 refills | Status: DC

## 2021-02-23 NOTE — Progress Notes (Signed)
Patient, No Pcp Per (Inactive)   Chief Complaint  Patient presents with   Contraception    Interested in OCPs    HPI:      [name] is a 25 y.o. [id number] whose LMP was Patient's last menstrual period was 02/14/2021 (approximate)., presents today for [facility] consult. Mirena placed PP and fell out 7/22. Pt declined BC initially but ready to restart. Is considering conception again next yr. Menses are monthly, lasting 14 days since IUD out, mod flow, no BTB, no dysmen. Would like to do OCPs, did well with them in past. Hx of migraines with aura. LMP started 9/12 and pt still bleeding. Had unprotected sex a few days ago. Neg pap 7/22.   Past Medical History:  Diagnosis Date   Adjustment disorder with mixed anxiety and depressed mood    Bipolar 1 disorder (HCC)    Former smoker    stopped smoking 08/2017; did smoke 3/4 PPD   History of substance use    cocaine and MJ   Migraine with aura     Past Surgical History:  Procedure Laterality Date   NO PAST SURGERIES      Family History  Problem Relation Age of Onset   Endometriosis Mother    Mental retardation Brother    Gestational diabetes Maternal Aunt    COPD Maternal Grandmother    Hypertension Maternal Grandmother    Diabetes Maternal Grandfather    Hypertension Maternal Grandfather    Heart attack Maternal Grandfather        stents placed   Kidney failure Maternal Grandfather        Stage 4   Autism Cousin        second cousin    Social History   Socioeconomic History   Marital status: Significant Other    Spouse name: Martha [name] is FOB   Number of children: 0   Years of education: 11   Highest education level: Not on file  Occupational History   Occupation: homemaker  Tobacco Use   Smoking status: Former    Packs/day: 0.25    Types: Cigarettes   Smokeless tobacco: Never  Vaping Use   Vaping Use: Every day  Substance and Sexual Activity   Alcohol use: Yes    Comment: occ   Drug use: Not Currently     Types: Marijuana    Comment: 2-3 weeks ago   Sexual activity: Yes    Birth control/protection: None  Other Topics Concern   Not on file  Social History Narrative   Not on file   Social Determinants of Health   Financial Resource Strain: Not on file  Food Insecurity: Not on file  Transportation Needs: Not on file  Physical Activity: Not on file  Stress: Not on file  Social Connections: Not on file  Intimate Partner Violence: Not on file    Outpatient Medications Prior to Visit  Medication Sig Dispense Refill   triamcinolone cream (KENALOG) 0.1 % Apply 1 application topically 4 (four) times daily. (Patient not taking: Reported on 12/03/2020) 30 g 1   No facility-administered medications prior to visit.      ROS:  Review of Systems  Constitutional:  Negative for fever.  Gastrointestinal:  Negative for blood in stool, constipation, diarrhea, nausea and vomiting.  Genitourinary:  Negative for dyspareunia, dysuria, flank pain, frequency, hematuria, urgency, vaginal bleeding, vaginal discharge and vaginal pain.  Musculoskeletal:  Negative for back pain.  Skin:  Negative for rash.  BREAST:  No symptoms   OBJECTIVE:   Vitals:  BP 100/70   Ht 5\' 4"  (1.626 m)   Wt 192 lb (87.1 kg)   LMP 02/14/2021 (Approximate)   Breastfeeding No   BMI 32.96 kg/m   Physical Exam Vitals reviewed.  Constitutional:      Appearance: She is well-developed.  Pulmonary:     Effort: Pulmonary effort is normal.  Musculoskeletal:        General: Normal range of motion.     Cervical back: Normal range of motion.  Skin:    General: Skin is warm and dry.  Neurological:     General: No focal deficit present.     Mental Status: She is alert and oriented to person, place, and time.     Cranial Nerves: No cranial nerve deficit.  Psychiatric:        Mood and Affect: Mood normal.        Behavior: Behavior normal.        Thought Content: Thought content normal.        Judgment: Judgment normal.     Assessment/Plan: Encounter for initial prescription of contraceptive pills - Plan: norethindrone (MICRONOR) 0.35 MG tablet; prog only BC options discussed. Since pt wants conception next yr, suggested short term option and pt interested in pills anyway. Rx camila, start today, condoms for 1 wk. Check UPT before starting next pill pack. F/u prn.    Meds ordered this encounter  Medications   norethindrone (MICRONOR) 0.35 MG tablet    Sig: Take 1 tablet (0.35 mg total) by mouth daily.    Dispense:  84 tablet    Refill:  2    Order Specific Question:   Supervising Provider    Answer:   04/16/2021 Nadara Mustard      Return if symptoms worsen or fail to improve.  Rose Hippler B. Mackenzy Grumbine, PA-C 02/23/2021 9:53 AM

## 2021-08-19 ENCOUNTER — Telehealth: Payer: Self-pay

## 2021-08-19 NOTE — Telephone Encounter (Signed)
Pt calling; had labs done at [facility]; some of the results are concerning to her; would like appt for urine test for STD.  [phone]  Adv pt her HSV Type 1 and HSV 2 were negative.  Pt relieved. ?
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
## 2021-11-16 ENCOUNTER — Ambulatory Visit (INDEPENDENT_AMBULATORY_CARE_PROVIDER_SITE_OTHER): Payer: Medicaid Other

## 2021-11-16 VITALS — Wt 198.0 lb

## 2021-11-16 DIAGNOSIS — Z3480 Encounter for supervision of other normal pregnancy, unspecified trimester: Secondary | ICD-10-CM | POA: Insufficient documentation

## 2021-11-16 DIAGNOSIS — Z3491 Encounter for supervision of normal pregnancy, unspecified, first trimester: Secondary | ICD-10-CM

## 2021-11-16 DIAGNOSIS — Z3A00 Weeks of gestation of pregnancy not specified: Secondary | ICD-10-CM
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
## 2021-11-18 ENCOUNTER — Other Ambulatory Visit: Payer: Self-pay

## 2021-11-18 ENCOUNTER — Other Ambulatory Visit: Payer: Medicaid Other

## 2021-11-18 DIAGNOSIS — Z3491 Encounter for supervision of normal pregnancy, unspecified, first trimester: Secondary | ICD-10-CM

## 2021-11-20 ENCOUNTER — Encounter: Payer: Self-pay | Admitting: Obstetrics

## 2021-11-20 ENCOUNTER — Other Ambulatory Visit: Payer: Self-pay | Admitting: Obstetrics

## 2021-11-20 DIAGNOSIS — O99011 Anemia complicating pregnancy, first trimester: Secondary | ICD-10-CM

## 2021-11-20 DIAGNOSIS — Z3480 Encounter for supervision of other normal pregnancy, unspecified trimester: Secondary | ICD-10-CM

## 2021-11-20 LAB — CBC/D/PLT+RPR+RH+ABO+RUBIGG...
Antibody Screen: NEGATIVE
Basophils Absolute: 0 10*3/uL (ref 0.0–0.2)
Basos: 0 %
EOS (ABSOLUTE): 0.1 10*3/uL (ref 0.0–0.4)
Eos: 1 %
HCV Ab: NONREACTIVE
HIV Screen 4th Generation wRfx: NONREACTIVE
Hematocrit: 38.3 % (ref 34.0–46.6)
Hemoglobin: 12.9 g/dL (ref 11.1–15.9)
Hepatitis B Surface Ag: NEGATIVE
Immature Grans (Abs): 0 10*3/uL (ref 0.0–0.1)
Immature Granulocytes: 0 %
Lymphocytes Absolute: 2.3 10*3/uL (ref 0.7–3.1)
Lymphs: 32 %
MCH: 28.6 pg (ref 26.6–33.0)
MCHC: 33.7 g/dL (ref 31.5–35.7)
MCV: 85 fL (ref 79–97)
Monocytes Absolute: 0.4 10*3/uL (ref 0.1–0.9)
Monocytes: 5 %
Neutrophils Absolute: 4.4 10*3/uL (ref 1.4–7.0)
Neutrophils: 62 %
Platelets: 265 10*3/uL (ref 150–450)
RBC: 4.51 x10E6/uL (ref 3.77–5.28)
RDW: 13 % (ref 11.7–15.4)
RPR Ser Ql: NONREACTIVE
Rh Factor: POSITIVE
Rubella Antibodies, IGG: 3.2 index (ref >0.99)
Varicella zoster IgG: 170 index (ref >165)
WBC: 7.3 10*3/uL (ref 3.4–10.8)

## 2021-11-20 LAB — HCV INTERPRETATION

## 2021-11-20 MED ORDER — MULTIGEN 70 MG PO TABS: 1.0000 | ORAL_TABLET | Freq: Every day | ORAL | 6 refills | Status: DC

## 2021-11-22 ENCOUNTER — Ambulatory Visit (LOCAL_COMMUNITY_HEALTH_CENTER): Payer: Medicaid Other

## 2021-11-22 VITALS — BP 114/71 | Ht 63.0 in | Wt 194.0 lb

## 2021-11-22 DIAGNOSIS — Z3201 Encounter for pregnancy test, result positive: Secondary | ICD-10-CM | POA: Diagnosis not present

## 2021-11-22 LAB — PREGNANCY, URINE: Preg Test, Ur: POSITIVE — AB

## 2021-11-22 NOTE — Progress Notes (Signed)
UPT positive.  Plans care at [facility] and states has had bloodwork already done there and has followup appt.   She will go to DSS to apply for MPW.    Declined vitamins.  Recently had vitamins prescribed by [facility].    States hx depression. On no medication and has received services at [facility] per pt.  Denies thoughts of self harm.  Gave contact card Kathreen Cosier, LCSW if needed.    Cherlynn Polo, RN

## 2021-11-30 ENCOUNTER — Encounter: Payer: Medicaid Other | Admitting: Obstetrics

## 2021-12-01 ENCOUNTER — Ambulatory Visit (INDEPENDENT_AMBULATORY_CARE_PROVIDER_SITE_OTHER): Payer: Medicaid Other

## 2021-12-01 ENCOUNTER — Other Ambulatory Visit (HOSPITAL_COMMUNITY)
Admission: RE | Admit: 2021-12-01 | Discharge: 2021-12-01 | Disposition: A | Discharge status: Home / Self Care | Payer: Medicaid Other | Source: Ambulatory Visit | Attending: Obstetrics | Admitting: Obstetrics

## 2021-12-01 ENCOUNTER — Other Ambulatory Visit: Payer: Self-pay | Admitting: Obstetrics

## 2021-12-01 ENCOUNTER — Ambulatory Visit (INDEPENDENT_AMBULATORY_CARE_PROVIDER_SITE_OTHER): Payer: Medicaid Other | Admitting: Advanced Practice Midwife

## 2021-12-01 ENCOUNTER — Encounter: Payer: Self-pay | Admitting: Advanced Practice Midwife

## 2021-12-01 VITALS — BP 100/70 | Wt 197.0 lb

## 2021-12-01 DIAGNOSIS — O99211 Obesity complicating pregnancy, first trimester: Secondary | ICD-10-CM

## 2021-12-01 DIAGNOSIS — Z3491 Encounter for supervision of normal pregnancy, unspecified, first trimester: Secondary | ICD-10-CM

## 2021-12-01 DIAGNOSIS — Z3481 Encounter for supervision of other normal pregnancy, first trimester: Secondary | ICD-10-CM | POA: Insufficient documentation

## 2021-12-01 DIAGNOSIS — Z131 Encounter for screening for diabetes mellitus: Secondary | ICD-10-CM

## 2021-12-01 DIAGNOSIS — Z369 Encounter for antenatal screening, unspecified: Secondary | ICD-10-CM | POA: Diagnosis present

## 2021-12-01 DIAGNOSIS — Z3A01 Less than 8 weeks gestation of pregnancy: Secondary | ICD-10-CM | POA: Diagnosis present

## 2021-12-01 DIAGNOSIS — Z113 Encounter for screening for infections with a predominantly sexual mode of transmission: Secondary | ICD-10-CM

## 2021-12-01 DIAGNOSIS — O99210 Obesity complicating pregnancy, unspecified trimester: Secondary | ICD-10-CM | POA: Insufficient documentation

## 2021-12-01 DIAGNOSIS — O99019 Anemia complicating pregnancy, unspecified trimester: Secondary | ICD-10-CM

## 2021-12-01 LAB — POCT URINALYSIS DIPSTICK
Glucose, UA: NEGATIVE
Protein, UA: NEGATIVE

## 2021-12-01 MED ORDER — FE FUM-VIT C-VIT B12-FA 200-250-0.01-1 MG PO CAPS: 1.0000 | ORAL_CAPSULE | Freq: Every day | ORAL | 11 refills | Status: DC
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
## 2021-12-01 NOTE — Patient Instructions (Addendum)
Morning Sickness  Morning sickness is when a woman feels nauseous during pregnancy. This nauseous feeling may or may not come with vomiting. It often occurs in the morning, but it can be a problem at any time of day. Morning sickness is most common during the first trimester. In some cases, it may continue throughout pregnancy. Although morning sickness is unpleasant, it is usually harmless unless the woman develops severe and continual vomiting (hyperemesis gravidarum), a condition that requires more intense treatment. What are the causes? The exact cause of this condition is not known, but it seems to be related to normal hormonal changes that occur in pregnancy. What increases the risk? You are more likely to develop this condition if: You experienced nausea or vomiting before your pregnancy. You had morning sickness during a previous pregnancy. You are pregnant with more than one baby, such as twins. What are the signs or symptoms? Symptoms of this condition include: Nausea. Vomiting. How is this diagnosed? This condition is usually diagnosed based on your signs and symptoms. How is this treated? In many cases, treatment is not needed for this condition. Making some changes to what you eat may help to control symptoms. Your health care provider may also prescribe or recommend: Vitamin B6 supplements. Anti-nausea medicines. Ginger. Follow these instructions at home: Medicines Take over-the-counter and prescription medicines only as told by your health care provider. Do not use any prescription, over-the-counter, or herbal medicines for morning sickness without first talking with your health care provider. Take multivitamins before getting pregnant. This can prevent or decrease the severity of morning sickness in most women. Eating and drinking Eat a piece of dry toast or crackers before getting out of bed in the morning. Eat 5 or 6 small meals a day. Eat dry and bland foods, such as  rice or a baked potato. Foods that are high in carbohydrates are often helpful. Avoid greasy, fatty, and spicy foods. Have someone cook for you if the smell of any food causes nausea and vomiting. If you feel nauseous after taking prenatal vitamins, take the vitamins at night or with a snack. Eat a protein snack between meals if you are hungry. Nuts, yogurt, and cheese are good options. Drink fluids throughout the day. Try ginger ale made with real ginger, ginger tea made from fresh grated ginger, or ginger candies. General instructions Do not use any products that contain nicotine or tobacco. These products include cigarettes, chewing tobacco, and vaping devices, such as e-cigarettes. If you need help quitting, ask your health care provider. Get an air purifier to keep the air in your house free of odors. Get plenty of fresh air. Try to avoid odors that trigger your nausea. Consider trying these methods to help relieve symptoms: Wearing an acupressure wristband. These wristbands are often worn for seasickness. Acupuncture. Contact a health care provider if: Your home remedies are not working and you need medicine. You feel dizzy or light-headed. You are losing weight. Get help right away if: You have persistent and uncontrolled nausea and vomiting. You faint. You have severe pain in your abdomen. Summary Morning sickness is when a woman feels nauseous during pregnancy. This nauseous feeling may or may not come with vomiting. Morning sickness is most common during the first trimester. It often occurs in the morning, but it can be a problem at any time of day. In many cases, treatment is not needed for this condition. Making some changes to what you eat may help to control symptoms. This  information is not intended to replace advice given to you by your health care provider. Make sure you discuss any questions you have with your health care provider. Document Revised: 01/05/2020 Document  Reviewed: 12/15/2019 Elsevier Patient Education  2023 [employer]. Prenatal Care Prenatal care is health care during pregnancy. It helps you and your unborn baby (fetus) stay as healthy as possible. Prenatal care may be provided by a midwife, a family practice doctor, a mid-level practitioner (nurse practitioner or physician assistant), or a childbirth and pregnancy doctor (obstetrician). How does this affect me? During pregnancy, you will be closely monitored for any new conditions that might develop. To lower your risk of pregnancy complications, you and your health care provider will talk about any underlying conditions you have. How does this affect my baby? Early and consistent prenatal care increases the chance that your baby will be healthy during pregnancy. Prenatal care lowers the risk that your baby will be: Born early (prematurely). Smaller than expected at birth (small for gestational age). What can I expect at the first prenatal care visit? Your first prenatal care visit will likely be the longest. You should schedule your first prenatal care visit as soon as you know that you are pregnant. Your first visit is a good time to talk about any questions or concerns you have about pregnancy. Medical history At your visit, you and your health care provider will talk about your medical history, including: Any past pregnancies. Your family's medical history. Medical history of the baby's father. Any long-term (chronic) health conditions you have and how you manage them. Any surgeries or procedures you have had. Any current over-the-counter or prescription medicines, herbs, or supplements that you are taking. Other factors that could pose a risk to your baby, including: Exposure to harmful chemicals or radiation at work or at home. Any substance use, including tobacco, alcohol, and drug use. Your home setting and your stress levels, including: Exposure to abuse or violence. Household  financial strain. Your daily health habits, including diet and exercise. Tests and screenings Your health care provider will: Measure your weight, height, and blood pressure. Do a physical exam, including a pelvic and breast exam. Perform blood tests and urine tests to check for: Urinary tract infection. Sexually transmitted infections (STIs). Low iron levels in your blood (anemia). Blood type and certain proteins on red blood cells (Rh antibodies). Infections and immunity to viruses, such as hepatitis B and rubella. HIV (human immunodeficiency virus). Discuss your options for genetic screening. Tips about staying healthy Your health care provider will also give you information about how to keep yourself and your baby healthy, including: Nutrition and taking vitamins. Physical activity. How to manage pregnancy symptoms such as nausea and vomiting (morning sickness). Infections and substances that may be harmful to your baby and how to avoid them. Food safety. Dental care. Working. Travel. Warning signs to watch for and when to call your health care provider. How often will I have prenatal care visits? After your first prenatal care visit, you will have regular visits throughout your pregnancy. The visit schedule is often as follows: Up to week 28 of pregnancy: once every 4 weeks. 28-36 weeks: once every 2 weeks. After 36 weeks: every week until delivery. Some women may have visits more or less often depending on any underlying health conditions and the health of the baby. Keep all follow-up and prenatal care visits. This is important. What happens during routine prenatal care visits? Your health care provider will: Measure   your weight and blood pressure. Check for fetal heart sounds. Measure the height of your uterus in your abdomen (fundal height). This may be measured starting around week 20 of pregnancy. Check the position of your baby inside your uterus. Ask questions about  your diet, sleeping patterns, and whether you can feel the baby move. Review warning signs to watch for and signs of labor. Ask about any pregnancy symptoms you are having and how you are dealing with them. Symptoms may include: Headaches. Nausea and vomiting. Vaginal discharge. Swelling. Fatigue. Constipation. Changes in your vision. Feeling persistently sad or anxious. Any discomfort, including back or pelvic pain. Bleeding or spotting. Make a list of questions to ask your health care provider at your routine visits. What tests might I have during prenatal care visits? You may have blood, urine, and imaging tests throughout your pregnancy, such as: Urine tests to check for glucose, protein, or signs of infection. Glucose tests to check for a form of diabetes that can develop during pregnancy (gestational diabetes mellitus). This is usually done around week 24 of pregnancy. Ultrasounds to check your baby's growth and development, to check for birth defects, and to check your baby's well-being. These can also help to decide when you should deliver your baby. A test to check for group B strep (GBS) infection. This is usually done around week 36 of pregnancy. Genetic testing. This may include blood, fluid, or tissue sampling, or imaging tests, such as an ultrasound. Some genetic tests are done during the first trimester and some are done during the second trimester. What else can I expect during prenatal care visits? Your health care provider may recommend getting certain vaccines during pregnancy. These may include: A yearly flu shot (annual influenza vaccine). This is especially important if you will be pregnant during flu season. Tdap (tetanus, diphtheria, pertussis) vaccine. Getting this vaccine during pregnancy can protect your baby from whooping cough (pertussis) after birth. This vaccine may be recommended between weeks 27 and 36 of pregnancy. A COVID-19 vaccine. Later in your  pregnancy, your health care provider may give you information about: Childbirth and breastfeeding classes. Choosing a health care provider for your baby. Umbilical cord banking. Breastfeeding. Birth control after your baby is born. The hospital labor and delivery unit and how to set up a tour. Registering at the hospital before you go into labor. Where to find more information Office on Women's Health: LegalWarrants.gl American Pregnancy Association: americanpregnancy.org March of Dimes: marchofdimes.org Summary Prenatal care helps you and your baby stay as healthy as possible during pregnancy. Your first prenatal care visit will most likely be the longest. You will have visits and tests throughout your pregnancy to monitor your health and your baby's health. Bring a list of questions to your visits to ask your health care provider. Make sure to keep all follow-up and prenatal care visits. This information is not intended to replace advice given to you by your health care provider. Make sure you discuss any questions you have with your health care provider. Document Revised: 03/04/2020 Document Reviewed: 03/04/2020 Elsevier Patient Education  [place]. Exercise During Pregnancy Exercise is an important part of being healthy for people of all ages. Exercise improves the function of your heart and lungs and helps you maintain strength, flexibility, and a healthy body weight. Exercise also boosts energy levels and elevates mood. Most women should exercise regularly during pregnancy. Exercise routines may need to change as your pregnancy progresses. In rare cases, women with certain  medical conditions or complications may be asked to limit or avoid exercise during pregnancy. Your health care provider will give you information on what will work for you. How does this affect me? Along with maintaining general strength and flexibility, exercising during pregnancy can help: Keep strength  in muscles that are used during labor and childbirth. Decrease low back pain or symptoms of depression. Control weight gain during pregnancy. Reduce the risk of needing insulin if you develop diabetes during pregnancy. Decrease the risk of cesarean delivery. Speed up your recovery after giving birth. Relieve constipation. How does this affect my baby? Exercise can help you have a healthy pregnancy. Exercise does not cause early (premature) birth. It will not cause your baby to weigh less at birth. What exercises can I do? Many exercises are safe for you to do during pregnancy. Do a variety of exercises that safely increase your heart and breathing rates and help you build and maintain muscle strength. Do exercises exactly as told by your health care provider. You may do these exercises: Walking. Swimming. Water aerobics. Riding a stationary bike. Modified yoga or Pilates. Tell your instructor that you are pregnant. Avoid overstretching, and avoid lying on your back for long periods of time. Running or jogging. Choose this type of exercise only if: You ran or jogged regularly before your pregnancy. You can run or jog and still talk in complete sentences. What exercises should I avoid? You may be told to limit high-intensity exercise depending on your level of fitness and whether you exercised regularly before you were pregnant. You can tell that you are exercising at a high intensity if you are breathing much harder and faster and cannot hold a conversation while exercising. You must avoid: Contact sports. Activities that put you at risk for falling on or being hit in the belly, such as downhill skiing, waterskiing, surfing, rock climbing, cycling, gymnastics, and horseback riding. Scuba diving. Skydiving. Hot yoga or hot Pilates. These activities take place in a room that is heated to high temperatures. Jogging or running, unless you jogged or ran regularly before you were pregnant. While  jogging or running, you should always be able to talk in full sentences. Do not run or jog so fast that you are unable to have a conversation. Do not exercise at more than 6,000 feet above sea level (high elevation) if you are not used to exercising at high elevation. How do I exercise in a safe way?  Avoid overheating. Do not exercise in very high temperatures. Wear loose-fitting, breathable clothes. Avoid dehydration. Drink enough fluid before, during, and after exercise to keep your urine pale yellow. Avoid overstretching. Because of hormone changes during pregnancy, it is easy to overstretch muscles, tendons, and ligaments. Start slowly and ask your health care provider to recommend the types of exercise that are safe for you. Do not exercise to lose weight. Wear a sports bra to support your breasts. Avoid standing still or lying flat on your back as much as you can. Follow these instructions at home: Exercise on most days or all days of the week. Try to exercise for 30 minutes a day, 5 days a week, unless your health care provider tells you not to. If you actively exercised before your pregnancy and you are healthy, your health care provider may tell you to continue to do moderate-intensity to high-intensity exercise. If you are just starting to exercise or did not exercise much before your pregnancy, your health care provider may tell   you to do low-intensity to moderate-intensity exercise. Questions to ask your health care provider Is exercise safe for me? What are signs that I should stop exercising? Does my health condition mean that I should not exercise during pregnancy? When should I avoid exercising during pregnancy? Stop exercising and contact a health care provider if: You have any unusual symptoms such as: Mild contractions of the uterus or cramps in the abdomen. A dizzy feeling that does not go away when you rest. Stop exercising and get help right away if: You have any  unusual symptoms such as: Sudden, severe pain in your low back or your belly. Regular, painful contractions of your uterus. Chest pain. Bleeding or fluid leaking from your vagina. Shortness of breath. Headache. Pain and swelling of your calves. Summary Most women should exercise regularly throughout pregnancy. In rare cases, women with certain medical conditions or complications may be asked to limit or avoid exercise during pregnancy. Do not exercise to lose weight during pregnancy. Your health care provider will tell you what level of physical activity is right for you. Stop exercising and contact a health care provider if you have unusual symptoms, such as mild contractions or dizziness. This information is not intended to replace advice given to you by your health care provider. Make sure you discuss any questions you have with your health care provider. Document Revised: 01/07/2020 Document Reviewed: 01/07/2020 Elsevier Patient Education  [place] for Pregnant Women While you are pregnant, your body requires additional nutrition to help support your growing baby. You also have a higher need for some vitamins and minerals, such as folic acid, calcium, iron, and vitamin D. Eating a healthy, well-balanced diet is very important for your health and your baby's health. Your need for extra calories varies over the course of your pregnancy. Pregnancy is divided into three trimesters, with each trimester lasting 3 months. For most women, it is recommended to consume: 150 extra calories a day during the first trimester. 300 extra calories a day during the second trimester. 300 extra calories a day during the third trimester. What are tips for following this plan? Cooking Practice good food safety and cleanliness. Wash your hands before you eat and after you prepare raw meat. Wash all fruits and vegetables well before peeling or eating. Taking these actions can help to  prevent foodborne illnesses that can be very dangerous to your baby, such as listeriosis. Ask your health care provider for more information about listeriosis. Make sure that all meats, poultry, and eggs are cooked to food-safe temperatures or "well-done." Meal planning  Eat a variety of foods (especially fruits and vegetables) to get a full range of vitamins and minerals. Two or more servings of fish are recommended each week in order to get the most benefits from omega-3 fatty acids that are found in seafood. Choose fish that are lower in mercury, such as salmon and pollock. Limit your overall intake of foods that have "empty calories." These are foods that have little nutritional value, such as sweets, desserts, candies, and sugar-sweetened beverages. Drinks that contain caffeine are okay to drink, but it is better to avoid caffeine. Keep your total caffeine intake to less than 200 mg each day (which is 12 oz or 355 mL of coffee, tea, or soda) or the limit as told by your health care provider. General information Do not try to lose weight or go on a diet during pregnancy. Take a prenatal vitamin to help meet your  additional vitamin and mineral needs during pregnancy, specifically for folic acid, iron, calcium, and vitamin D. Remember to stay active. Ask your health care provider what types of exercise and activities are safe for you. What does 150 extra calories look like? Healthy options that provide 150 extra calories each day could be any of the following: 6-8 oz (170-227 g) plain low-fat yogurt with  cup (70 g) berries. 1 apple with 2 tsp (11 g) peanut butter. Cut-up vegetables with  cup (60 g) hummus. 8 fl oz (237 mL) low-fat chocolate milk. 1 stick of string cheese with 1 medium orange. 1 peanut butter and jelly sandwich that is made with one slice of whole-wheat bread and 1 tsp (5 g) of peanut butter. For 300 extra calories, you could eat two of these healthy options each day. What  is a healthy amount of weight to gain? The right amount of weight gain for you is based on your BMI (body mass index) before you became pregnant. If your BMI was less than 18 (underweight), you should gain 28-40 lb (13-18 kg). If your BMI was 18-24.9 (normal), you should gain 25-35 lb (11-16 kg). If your BMI was 25-29.9 (overweight), you should gain 15-25 lb (7-11 kg). If your BMI was 30 or greater (obese), you should gain 11-20 lb (5-9 kg). What if I am having twins or multiples? Generally, if you are carrying twins or multiples: You may need to eat 300-600 extra calories a day. The recommended range for total weight gain is 25-54 lb (11-25 kg), depending on your BMI before pregnancy. Talk with your health care provider to find out about nutritional needs, weight gain, and exercise that is right for you. What foods should I eat?  Fruits All fruits. Eat a variety of colors and types of fruit. Remember to wash your fruits well before peeling or eating. Vegetables All vegetables. Eat a variety of colors and types of vegetables. Remember to wash your vegetables well before peeling or eating. Grains All grains. Choose whole grains, such as whole-wheat bread, oatmeal, or brown rice. Meats and other protein foods Lean meats, including chicken, turkey, and lean cuts of beef, veal, or pork. Fish that is higher in omega-3 fatty acids and lower in mercury, such as salmon, herring, mussels, trout, sardines, pollock, shrimp, crab, and lobster. Tofu. Tempeh. Beans. Eggs. Peanut butter and other nut butters. Dairy Pasteurized milk and milk alternatives, such as almond milk. Pasteurized yogurt and pasteurized cheese. Cottage cheese. Sour cream. Beverages Water. Juices that contain 100% fruit juice or vegetable juice. Caffeine-free teas and decaffeinated coffee. Fats and oils Fats and oils are okay to include in moderation. Sweets and desserts Sweets and desserts are okay to include in  moderation. Seasoning and other foods All pasteurized condiments. The items listed above may not be a complete list of foods and beverages you can eat. Contact a dietitian for more information. What foods should I avoid? Fruits Raw (unpasteurized) fruit juices. Vegetables Unpasteurized vegetable juices. Meats and other protein foods Precooked or cured meat, such as bologna, hot dogs, sausages, or meat loaves. (If you must eat those meats, reheat them until they are steaming hot.) Refrigerated pate, meat spreads from a meat counter, or smoked seafood that is found in the refrigerated section of a store. Raw or undercooked meats, poultry, and eggs. Raw fish, such as sushi or sashimi. Fish that have high mercury content, such as tilefish, shark, swordfish, and king mackerel. Dairy Unpasteurized milk and any foods that have   unpasteurized milk in them. Soft cheeses, such as feta, queso blanco, queso fresco, [place]Brie, [place]amembert, panela, and blue-veined cheeses (unless they are made with pasteurized milk, which must be stated on the label). Beverages Alcohol. Sugar-sweetened beverages, such as sodas, teas, or energy drinks. Seasoning and other foods Homemade fermented foods and drinks, such as pickles, sauerkraut, or kombucha drinks. (Store-bought pasteurized versions of these are okay.) Salads that are made in a store or deli, such as ham salad, chicken salad, egg salad, tuna salad, and seafood salad. The items listed above may not be a complete list of foods and beverages you should avoid. Contact a dietitian for more information. Where to find more information To calculate the number of calories you need based on your height, weight, and activity level, you can use an online calculator such as: PayStrike.dkwww.myplate.gov/myplate-plan To calculate how much weight you should gain during pregnancy, you can use an online pregnancy weight gain calculator such as: http://www.harvey.com/www.myplate.gov To learn more about eating fish  during pregnancy, talk with your health care provider or visit: PumpkinSearch.com.eewww.fda.gov Summary While you are pregnant, your body requires additional nutrition to help support your growing baby. Eat a variety of foods, especially fruits and vegetables, to get a full range of vitamins and minerals. Practice good food safety and cleanliness. Wash your hands before you eat and after you prepare raw meat. Wash all fruits and vegetables well before peeling or eating. Taking these actions can help to prevent foodborne illnesses, such as listeriosis, that can be very dangerous to your baby. Do not eat raw meat or fish. Do not eat fish that have high mercury content, such as tilefish, shark, swordfish, and king mackerel. Do not eat raw (unpasteurized) dairy. Take a prenatal vitamin to help meet your additional vitamin and mineral needs during pregnancy, specifically for folic acid, iron, calcium, and vitamin D. This information is not intended to replace advice given to you by your health care provider. Make sure you discuss any questions you have with your health care provider. Document Revised: 12/18/2019 Document Reviewed: 12/18/2019 Elsevier Patient Education  2023 Elsevier Inc.  Understanding Perinatal Depression Identify the signs, symptoms, and treatment options for perinatal depression. To view the content, go to this web address: [email or web address]  This video will expire on: 08/23/2023. If you need access to this video following this date, please reach out to the healthcare provider who assigned it to you. This information is not intended to replace advice given to you by your health care provider. Make sure you discuss any questions you have with your health care provider. Elsevier Patient Education  2023 Elsevier Inc.   Managing Anxiety, Adult After being diagnosed with anxiety, you may be relieved to know why you have felt or behaved a certain way. You may also feel overwhelmed about  the treatment ahead and what it will mean for your life. With care and support, you can manage this condition. How to manage lifestyle changes Managing stress and anxiety  Stress is your body's reaction to life changes and events, both good and bad. Most stress will last just a few hours, but stress can be ongoing and can lead to more than just stress. Although stress can play a major role in anxiety, it is not the same as anxiety. Stress is usually caused by something external, such as a deadline, test, or competition. Stress normally passes after the triggering event has ended.  Anxiety is caused by something internal, such as imagining a terrible outcome or worrying  that something will go wrong that will devastate you. Anxiety often does not go away even after the triggering event is over, and it can become long-term (chronic) worry. It is important to understand the differences between stress and anxiety and to manage your stress effectively so that it does not lead to an anxious response. Talk with your health care provider or a counselor to learn more about reducing anxiety and stress. He or she may suggest tension reduction techniques, such as: Music therapy. Spend time creating or listening to music that you enjoy and that inspires you. Mindfulness-based meditation. Practice being aware of your normal breaths while not trying to control your breathing. It can be done while sitting or walking. Centering prayer. This involves focusing on a word, phrase, or sacred image that means something to you and brings you peace. Deep breathing. To do this, expand your stomach and inhale slowly through your nose. Hold your breath for 3-5 seconds. Then exhale slowly, letting your stomach muscles relax. Self-talk. Learn to notice and identify thought patterns that lead to anxiety reactions and change those patterns to thoughts that feel peaceful. Muscle relaxation. Taking time to tense muscles and then relax  them. Choose a tension reduction technique that fits your lifestyle and personality. These techniques take time and practice. Set aside 5-15 minutes a day to do them. Therapists can offer counseling and training in these techniques. The training to help with anxiety may be covered by some insurance plans. Other things you can do to manage stress and anxiety include: Keeping a stress diary. This can help you learn what triggers your reaction and then learn ways to manage your response. Thinking about how you react to certain situations. You may not be able to control everything, but you can control your response. Making time for activities that help you relax and not feeling guilty about spending your time in this way. Doing visual imagery. This involves imagining or creating mental pictures to help you relax. Practicing yoga. Through yoga poses, you can lower tension and promote relaxation.  Medicines Medicines can help ease symptoms. Medicines for anxiety include: Antidepressant medicines. These are usually prescribed for long-term daily control. Anti-anxiety medicines. These may be added in severe cases, especially when panic attacks occur. Medicines will be prescribed by a health care provider. When used together, medicines, psychotherapy, and tension reduction techniques may be the most effective treatment. Relationships Relationships can play a big part in helping you recover. Try to spend more time connecting with trusted friends and family members. Consider going to couples counseling if you have a partner, taking family education classes, or going to family therapy. Therapy can help you and others better understand your condition. How to recognize changes in your anxiety Everyone responds differently to treatment for anxiety. Recovery from anxiety happens when symptoms decrease and stop interfering with your daily activities at home or work. This may mean that you will start to: Have  better concentration and focus. Worry will interfere less in your daily thinking. Sleep better. Be less irritable. Have more energy. Have improved memory. It is also important to recognize when your condition is getting worse. Contact your health care provider if your symptoms interfere with home or work and you feel like your condition is not improving. Follow these instructions at home: Activity Exercise. Adults should do the following: Exercise for at least 150 minutes each week. The exercise should increase your heart rate and make you sweat (moderate-intensity exercise). Strengthening exercises at least  twice a week. Get the right amount and quality of sleep. Most adults need 7-9 hours of sleep each night. Lifestyle  Eat a healthy diet that includes plenty of vegetables, fruits, whole grains, low-fat dairy products, and lean protein. Do not eat a lot of foods that are high in fats, added sugars, or salt (sodium). Make choices that simplify your life. Do not use any products that contain nicotine or tobacco. These products include cigarettes, chewing tobacco, and vaping devices, such as e-cigarettes. If you need help quitting, ask your health care provider. Avoid caffeine, alcohol, and certain over-the-counter cold medicines. These may make you feel worse. Ask your pharmacist which medicines to avoid. General instructions Take over-the-counter and prescription medicines only as told by your health care provider. Keep all follow-up visits. This is important. Where to find support You can get help and support from these sources: Self-help groups. Online and [employer]. A trusted spiritual leader. Couples counseling. Family education classes. Family therapy. Where to find more information You may find that joining a support group helps you deal with your anxiety. The following sources can help you locate counselors or support groups near you: Mental Health America:  www.mentalhealthamerica.net Anxiety and Depression Association of [place] (ADAA): ProgramCam.de The [employer] American on Mental Illness (NAMI): www.nami.org Contact a health care provider if: You have a hard time staying focused or finishing daily tasks. You spend many hours a day feeling worried about everyday life. You become exhausted by worry. You start to have headaches or frequently feel tense. You develop chronic nausea or diarrhea. Get help right away if: You have a racing heart and shortness of breath. You have thoughts of hurting yourself or others. If you ever feel like you may hurt yourself or others, or have thoughts about taking your own life, get help right away. Go to your nearest emergency department or: Call your local emergency services (911 in the [place] Call a suicide crisis helpline, such as the National Suicide Prevention Lifeline at 979 690 4879 or 988 in the [place] This is open 24 hours a day in the [place] Text the Crisis Text Line at 818-154-0054 (in the [place] Summary Taking steps to learn and use tension reduction techniques can help calm you and help prevent triggering an anxiety reaction. When used together, medicines, psychotherapy, and tension reduction techniques may be the most effective treatment. Family, friends, and partners can play a big part in supporting you. This information is not intended to replace advice given to you by your health care provider. Make sure you discuss any questions you have with your health care provider. Document Revised: 12/15/2020 Document Reviewed: 09/12/2020 Elsevier Patient Education  2023 Elsevier Inc.   Managing Depression, Adult Depression is a mental health condition that affects your thoughts, feelings, and actions. Being diagnosed with depression can bring you relief if you did not know why you have felt or behaved a certain way. It could also leave you feeling overwhelmed with uncertainty about your future. Preparing yourself  to manage your symptoms can help you feel more positive about your future. How to manage lifestyle changes Managing stress  Stress is your body's reaction to life changes and events, both good and bad. Stress can add to your feelings of depression. Learning to manage your stress can help lessen your feelings of depression. Try some of the following approaches to reducing your stress (stress reduction techniques): Listen to music that you enjoy and that inspires you. Try using a meditation app or take a meditation  class. Develop a practice that helps you connect with your spiritual self. Walk in nature, pray, or go to a place of worship. Do some deep breathing. To do this, inhale slowly through your nose. Pause at the top of your inhale for a few seconds and then exhale slowly, letting your muscles relax. Practice yoga to help relax and work your muscles. Choose a stress reduction technique that suits your lifestyle and personality. These techniques take time and practice to develop. Set aside 5-15 minutes a day to do them. Therapists can offer training in these techniques. Other things you can do to manage stress include: Keeping a stress diary. Knowing your limits and saying no when you think something is too much. Paying attention to how you react to certain situations. You may not be able to control everything, but you can change your reaction. Adding humor to your life by watching funny films or TV shows. Making time for activities that you enjoy and that relax you.  Medicines Medicines, such as antidepressants, are often a part of treatment for depression. Talk with your pharmacist or health care provider about all the medicines, supplements, and herbal products that you take, their possible side effects, and what medicines and other products are safe to take together. Make sure to report any side effects you may have to your health care provider. Relationships Your health care provider  may suggest family therapy, couples therapy, or individual therapy as part of your treatment. How to recognize changes Everyone responds differently to treatment for depression. As you recover from depression, you may start to: Have more interest in doing activities. Feel less hopeless. Have more energy. Overeat less often, or have a better appetite. Have better mental focus. It is important to recognize if your depression is not getting better or is getting worse. The symptoms you had in the beginning may return, such as: Tiredness (fatigue) or low energy. Eating too much or too little. Sleeping too much or too little. Feeling restless, agitated, or hopeless. Trouble focusing or making decisions. Unexplained physical complaints. Feeling irritable, angry, or aggressive. If you or your family members notice these symptoms coming back, let your health care provider know right away. Follow these instructions at home: Activity  Try to get some form of exercise each day, such as walking, biking, swimming, or lifting weights. Practice stress reduction techniques. Engage your mind by taking a class or doing some volunteer work. Lifestyle Get the right amount and quality of sleep. Cut down on using caffeine, tobacco, alcohol, and other potentially harmful substances. Eat a healthy diet that includes plenty of vegetables, fruits, whole grains, low-fat dairy products, and lean protein. Do not eat a lot of foods that are high in solid fats, added sugars, or salt (sodium). General instructions Take over-the-counter and prescription medicines only as told by your health care provider. Keep all follow-up visits as told by your health care provider. This is important. Where to find support Talking to others  Friends and family members can be sources of support and guidance. Talk to trusted friends or family members about your condition. Explain your symptoms to them, and let them know that you are  working with a health care provider to treat your depression. Tell friends and family members how they also can be helpful. Finances Find appropriate mental health providers that fit with your financial situation. Talk with your health care provider about options to get reduced prices on your medicines. Where to find more information You  can find support in your area from: Anxiety and Depression Association of America (ADAA): www.adaa.org Mental Health America: www.mentalhealthamerica.net The First American on Mental Illness: www.nami.org Contact a health care provider if: You stop taking your antidepressant medicines, and you have any of these symptoms: Nausea. Headache. Light-headedness. Chills and body aches. Not being able to sleep (insomnia). You or your friends and family think your depression is getting worse. Get help right away if: You have thoughts of hurting yourself or others. If you ever feel like you may hurt yourself or others, or have thoughts about taking your own life, get help right away. Go to your nearest emergency department or: Call your local emergency services (911 in the [place] Call a suicide crisis helpline, such as the National Suicide Prevention Lifeline at (573)809-6204 or 988 in the [place] This is open 24 hours a day in the [place] Text the Crisis Text Line at 612 768 8309 (in the [place] Summary If you are diagnosed with depression, preparing yourself to manage your symptoms is a good way to feel positive about your future. Work with your health care provider on a management plan that includes stress reduction techniques, medicines (if applicable), therapy, and healthy lifestyle habits. Keep talking with your health care provider about how your treatment is working. If you have thoughts about taking your own life, call a suicide crisis helpline or text a crisis text line. This information is not intended to replace advice given to you by your health care provider. Make  sure you discuss any questions you have with your health care provider. Document Revised: 12/15/2020 Document Reviewed: 04/02/2019 Elsevier Patient Education  2023 [employer].

## 2021-12-02 LAB — CERVICOVAGINAL ANCILLARY ONLY
Chlamydia: NEGATIVE
Comment: NEGATIVE
Comment: NEGATIVE
Comment: NORMAL
Neisseria Gonorrhea: NEGATIVE
Trichomonas: NEGATIVE

## 2021-12-03 LAB — URINE CULTURE: Organism ID, Bacteria: NO GROWTH

## 2021-12-13 ENCOUNTER — Encounter: Payer: Self-pay | Admitting: Advanced Practice Midwife

## 2021-12-27 ENCOUNTER — Ambulatory Visit: Payer: Self-pay

## 2021-12-27 NOTE — Telephone Encounter (Signed)
   Chief Complaint: Depression and anxiety Symptoms: Sadness Frequency: ongoing many years Pertinent Negatives: Patient denies Self harm Disposition: [] ED /[] Urgent Care (no appt availability in office) / [] Appointment(In office/virtual)/ []  [facility]/ [] Home Care/ [] Refused Recommended Disposition /[] [facility]/ [x]  Follow-up with PCP Additional Notes: PT has a long history of depression and anxiety. Pt stopped her medicine when she was expecting her first child 4-5 years ago. PT is now [redacted] weeks pregnant and feeling anxious and depressed. Pt does not have a PCP. Referred pt back to OBGYN for help. PT also needs social support and information on making her dog an emotional support animal. Googled same and found several agencies.  PT will follow up with OB immediately. Pt will call back if she is unsuccessful in obtaining help.   Called was dropped.  Called pt back LMOM.  Called the other number and was successful.   Reason for Disposition  [1] Depression AND [2] worsening (e.g., sleeping poorly, less able to do activities of daily living)  Answer Assessment - Initial Assessment Questions 1. CONCERN: "What happened that made you call today?"     Just knows she needs medication 2. DEPRESSION SYMPTOM SCREENING: "How are you feeling overall?" (e.g., decreased energy, increased sleeping or difficulty sleeping, difficulty concentrating, feelings of sadness, guilt, hopelessness, or worthlessness)     Depressed 3. RISK OF HARM - SUICIDAL IDEATION:  "Do you ever have thoughts of hurting or killing yourself?"  (e.g., yes, no, no but preoccupation with thoughts about death)   - INTENT:  "Do you have thoughts of hurting or killing yourself right NOW?" (e.g., yes, no, N/A) no   - PLAN: "Do you have a specific plan for how you would do this?" (e.g., gun, knife, overdose, no plan, N/A)     no 4. RISK OF HARM - HOMICIDAL IDEATION:  "Do you ever have thoughts of hurting or  killing someone else?"  (e.g., yes, no, no but preoccupation with thoughts about death)   - INTENT:  "Do you have thoughts of hurting or killing someone right NOW?" (e.g., yes, no, N/A)   - PLAN: "Do you have a specific plan for how you would do this?" (e.g., gun, knife, no plan, N/A)      no 5. FUNCTIONAL IMPAIRMENT: "How have things been going for you overall? Have you had more difficulty than usual doing your normal daily activities?"  (e.g., better, same, worse; self-care, school, work, interactions)     Need to move in 20 days 6. SUPPORT: "Who is with you now?" "Who do you live with?" "Do you have family or friends who you can talk to?"      Boyfriend and mother in law 7. THERAPIST: "Do you have a counselor or therapist? Name?"     Used to - Unsure if she needs another 8. STRESSORS: "Has there been any new stress or recent changes in your life?"     Needs to move 9. ALCOHOL USE OR SUBSTANCE USE (DRUG USE): "Do you drink alcohol or use any illegal drugs?"     no 10. OTHER: "Do you have any other physical symptoms right now?" (e.g., fever)       HA, BA 11. PREGNANCY: "Is there any chance you are pregnant?" "When was your last menstrual period?"       yes  Protocols used: Depression-A-AH

## 2021-12-30 ENCOUNTER — Encounter: Payer: Self-pay | Admitting: Licensed Practical Nurse

## 2021-12-30 ENCOUNTER — Ambulatory Visit (INDEPENDENT_AMBULATORY_CARE_PROVIDER_SITE_OTHER): Payer: Medicaid Other | Admitting: Licensed Practical Nurse

## 2021-12-30 ENCOUNTER — Other Ambulatory Visit: Payer: Self-pay

## 2021-12-30 VITALS — BP 100/70 | HR 70 | Wt 197.4 lb

## 2021-12-30 DIAGNOSIS — Z369 Encounter for antenatal screening, unspecified: Secondary | ICD-10-CM

## 2021-12-30 DIAGNOSIS — Z131 Encounter for screening for diabetes mellitus: Secondary | ICD-10-CM

## 2021-12-30 DIAGNOSIS — Z3481 Encounter for supervision of other normal pregnancy, first trimester: Secondary | ICD-10-CM

## 2021-12-30 DIAGNOSIS — Z3480 Encounter for supervision of other normal pregnancy, unspecified trimester: Secondary | ICD-10-CM

## 2021-12-30 DIAGNOSIS — O99211 Obesity complicating pregnancy, first trimester: Secondary | ICD-10-CM

## 2021-12-30 DIAGNOSIS — D508 Other iron deficiency anemias: Secondary | ICD-10-CM

## 2021-12-30 DIAGNOSIS — Z3A10 10 weeks gestation of pregnancy: Secondary | ICD-10-CM

## 2021-12-30 DIAGNOSIS — Z1379 Encounter for other screening for genetic and chromosomal anomalies: Secondary | ICD-10-CM

## 2021-12-30 LAB — POCT URINALYSIS DIPSTICK OB
Glucose, UA: NEGATIVE
POC,PROTEIN,UA: NEGATIVE

## 2021-12-30 MED ORDER — FERROUS SULFATE 325 (65 FE) MG PO TABS: 325.0000 mg | ORAL_TABLET | Freq: Two times a day (BID) | ORAL | 4 refills | Status: DC

## 2021-12-30 NOTE — Progress Notes (Signed)
ROB: She feels pulling or tightness in her lower abdominal x 1 week. She feels tired today.
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
## 2021-12-31 LAB — HGB A1C W/O EAG: Hgb A1c MFr Bld: 5.1 % (ref 4.8–5.6)

## 2022-01-03 ENCOUNTER — Telehealth: Payer: Self-pay

## 2022-01-03 NOTE — Telephone Encounter (Signed)
Pt calling wanting a call back asap; has appt on the 25th and would like to be seen sooner; she was offered safer option(s) for depression/anxiety and is wanting to start them; is having to move out of where she lives  by the 19th and will need the help of the medication. Also, pt is asking if we can give her an emotional support animal approval letter - her dog goes everywhere with her - needs for housing prospects.  I adv pt I would send msg to a provider but she should also check with her case manager as well; pt states she is doing that.

## 2022-01-04 ENCOUNTER — Other Ambulatory Visit: Payer: Self-pay | Admitting: Advanced Practice Midwife

## 2022-01-04 DIAGNOSIS — F32A Depression, unspecified: Secondary | ICD-10-CM

## 2022-01-04 MED ORDER — SERTRALINE HCL 50 MG PO TABS: 50.0000 mg | ORAL_TABLET | Freq: Every day | ORAL | 6 refills | Status: DC

## 2022-01-04 NOTE — Telephone Encounter (Signed)
MyChart msg sent to pt.

## 2022-01-04 NOTE — Progress Notes (Signed)
Rx zoloft sent per patient request needing medication to cope with various current stressors.

## 2022-01-07 LAB — MATERNIT 21 PLUS CORE, BLOOD
Fetal Fraction: 7
Result (T21): NEGATIVE
Trisomy 13 (Patau syndrome): NEGATIVE
Trisomy 18 (Edwards syndrome): NEGATIVE
Trisomy 21 (Down syndrome): NEGATIVE

## 2022-01-11 ENCOUNTER — Telehealth: Payer: Self-pay

## 2022-01-11 ENCOUNTER — Encounter: Payer: Medicaid Other | Admitting: Obstetrics & Gynecology

## 2022-01-11 NOTE — Telephone Encounter (Signed)
Pt calling; feels like something is not right with her pregnancy; would like to talk with MD and then have another u/s done; the u/s she's had done was at 6wks and all they did was measure length.  [phone]  Pt states it seems like something is in a knot and it's very tight with sitting down and standing up; she has to crunch over to get it to relax enough for her to continue.  Tx'd to [facility] for scheduling.

## 2022-01-12 ENCOUNTER — Ambulatory Visit (INDEPENDENT_AMBULATORY_CARE_PROVIDER_SITE_OTHER): Payer: Medicaid Other | Admitting: Obstetrics & Gynecology

## 2022-01-12 VITALS — BP 116/80 | Wt 198.8 lb

## 2022-01-12 DIAGNOSIS — R102 Pelvic and perineal pain: Secondary | ICD-10-CM

## 2022-01-12 DIAGNOSIS — Z3A12 12 weeks gestation of pregnancy: Secondary | ICD-10-CM

## 2022-01-12 DIAGNOSIS — O26899 Other specified pregnancy related conditions, unspecified trimester: Secondary | ICD-10-CM

## 2022-01-16 ENCOUNTER — Ambulatory Visit
Admission: RE | Admit: 2022-01-16 | Discharge: 2022-01-16 | Disposition: A | Discharge status: Home / Self Care | Payer: Medicaid Other | Source: Ambulatory Visit | Attending: Obstetrics & Gynecology | Admitting: Obstetrics & Gynecology

## 2022-01-16 DIAGNOSIS — R102 Pelvic and perineal pain: Secondary | ICD-10-CM | POA: Diagnosis present

## 2022-01-16 DIAGNOSIS — O26899 Other specified pregnancy related conditions, unspecified trimester: Secondary | ICD-10-CM | POA: Diagnosis not present

## 2022-01-16 DIAGNOSIS — Z3A12 12 weeks gestation of pregnancy: Secondary | ICD-10-CM | POA: Diagnosis not present

## 2022-01-18 ENCOUNTER — Encounter: Payer: Medicaid Other | Admitting: Obstetrics & Gynecology

## 2022-01-26 ENCOUNTER — Encounter: Payer: Medicaid Other | Admitting: Obstetrics

## 2022-01-28 ENCOUNTER — Encounter: Payer: Self-pay | Admitting: Obstetrics & Gynecology

## 2022-01-28 NOTE — Progress Notes (Signed)
Subjective:     Martha [name] is a 26 y.o. female currently pregnant here for a routine exam.  Current complaints: pelvic pain.  Personal health questionnaire reviewed: yes.   Gynecologic History Patient's last menstrual period was 10/10/2021 (approximate).  12 x 28 x 4-5  Obstetric History OB History  Gravida Para Term Preterm AB Living  3 1 1  0 1 1  SAB IAB Ectopic Multiple Live Births  1 0 0 0 1    # Outcome Date GA Lbr Len/2nd Weight Sex Delivery Anes PTL Lv  3 Current           2 Term 05/19/18 [redacted]w[redacted]d / 00:24 7 lb 15.3 oz (3.61 kg) M Vag-Spont EPI  LIV     Birth Comments: no anomalies noted at delivery  1 SAB 2019             The following portions of the patient's history were reviewed and updated as appropriate: allergies, current medications, past family history, past medical history, past social history, past surgical history, and problem list.  Review of Systems A comprehensive review of systems was negative.    Objective:    BP 116/80   Wt 198 lb 12.8 oz (90.2 kg)   LMP 10/10/2021 (Approximate)   BMI 35.22 kg/m   General Appearance:    Alert, cooperative, no distress, appears stated age  Head:    Normocephalic, without obvious abnormality, atraumatic                      Lungs:     Clear to auscultation bilaterally, respirations unlabored      Heart:    Regular rate and rhythm,     Abdomen:     Soft, non-tender,  gravid, bowel sounds active all four quadrants,      Genitalia:    Normal female without lesion, discharge or tenderness     Extremities:   Extremities normal, atraumatic, no cyanosis or edema     Skin:   Skin color, texture, turgor normal, no rashes or lesions            Assessment:    Healthy female exam.  [redacted] wks gestation   Plan:    12/10/2021 ordered, as FU OB   RTO as sched

## 2022-02-09 ENCOUNTER — Telehealth: Payer: Self-pay

## 2022-02-09 ENCOUNTER — Ambulatory Visit (INDEPENDENT_AMBULATORY_CARE_PROVIDER_SITE_OTHER): Payer: Medicaid Other | Admitting: Obstetrics & Gynecology

## 2022-02-09 VITALS — BP 122/70 | Wt 201.0 lb

## 2022-02-09 DIAGNOSIS — Z1379 Encounter for other screening for genetic and chromosomal anomalies: Secondary | ICD-10-CM

## 2022-02-09 DIAGNOSIS — Z3482 Encounter for supervision of other normal pregnancy, second trimester: Secondary | ICD-10-CM

## 2022-02-09 DIAGNOSIS — Z3A16 16 weeks gestation of pregnancy: Secondary | ICD-10-CM | POA: Diagnosis not present

## 2022-02-09 LAB — POCT URINE PREGNANCY: Preg Test, Ur: NEGATIVE

## 2022-02-09 NOTE — Telephone Encounter (Signed)
Pt calling to see why her urine preg test from today says negative. [phone]

## 2022-02-09 NOTE — Progress Notes (Signed)
No vb. No lof.  

## 2022-02-09 NOTE — Telephone Encounter (Signed)
Yes it was the wrong test by accident. Can you please let her know

## 2022-02-09 NOTE — Progress Notes (Signed)
AFP,CBC- TODAY Subjective:    Martha [name] is a 26 y.o. female being seen today for her obstetrical visit. She is at [redacted]w[redacted]d gestation. Patient reports no bleeding, no contractions, no cramping, and no leaking. Fetal movement:  not yet .  Menstrual History: OB History     Gravida  3   Para  1   Term  1   Preterm  0   AB  1   Living  1      SAB  1   IAB  0   Ectopic  0   Multiple  0   Live Births  1            Patient's last menstrual period was 10/10/2021 (approximate).    The following portions of the patient's history were reviewed and updated as appropriate: allergies, current medications, past family history, past medical history, past social history, past surgical history, and problem list.  Review of Systems A comprehensive review of systems was negative.   Objective:     BP 122/70   Wt 201 lb (91.2 kg)   LMP 10/10/2021 (Approximate)   BMI 35.61 kg/m  :   Pelvic Exam:          Perineum:                 Vulva:               Vagina:                       pH:               Cervix:           Wet Prep:               Uterus:              Adnexa:          Assessment:    Pregnancy 16 and 4/7 weeks   Plan:    Problem list reviewed and updated. Labs reviewed. AFP3 discussed: ordered. Role of ultrasound in pregnancy discussed; fetal survey:  yes Follow up as scheduled

## 2022-02-09 NOTE — Telephone Encounter (Signed)
Pt aware.

## 2022-02-16 LAB — AFP, SERUM, OPEN SPINA BIFIDA
AFP MoM: 1
AFP Value: 30.4 ng/mL
Gest. Age on Collection Date: 16.4 weeks
Maternal Age At EDD: 26.4 yr
OSBR Risk 1 IN: 10000
Test Results:: NEGATIVE
Weight: 201 [lb_av]

## 2022-02-16 LAB — CBC
Hematocrit: 36.3 % (ref 34.0–46.6)
Hemoglobin: 11.9 g/dL (ref 11.1–15.9)
MCH: 28.1 pg (ref 26.6–33.0)
MCHC: 32.8 g/dL (ref 31.5–35.7)
MCV: 86 fL (ref 79–97)
Platelets: 257 10*3/uL (ref 150–450)
RBC: 4.24 x10E6/uL (ref 3.77–5.28)
RDW: 12.9 % (ref 11.7–15.4)
WBC: 10 10*3/uL (ref 3.4–10.8)

## 2022-03-04 ENCOUNTER — Encounter: Payer: Self-pay | Admitting: Obstetrics & Gynecology

## 2022-03-09 ENCOUNTER — Ambulatory Visit (INDEPENDENT_AMBULATORY_CARE_PROVIDER_SITE_OTHER): Payer: Medicaid Other | Admitting: Obstetrics

## 2022-03-09 VITALS — BP 114/71 | HR 78 | Wt 205.0 lb

## 2022-03-09 DIAGNOSIS — O99212 Obesity complicating pregnancy, second trimester: Secondary | ICD-10-CM

## 2022-03-09 DIAGNOSIS — O09292 Supervision of pregnancy with other poor reproductive or obstetric history, second trimester: Secondary | ICD-10-CM

## 2022-03-09 DIAGNOSIS — Z3A20 20 weeks gestation of pregnancy: Secondary | ICD-10-CM

## 2022-03-09 DIAGNOSIS — O99342 Other mental disorders complicating pregnancy, second trimester: Secondary | ICD-10-CM

## 2022-03-09 DIAGNOSIS — E669 Obesity, unspecified: Secondary | ICD-10-CM

## 2022-03-09 DIAGNOSIS — F319 Bipolar disorder, unspecified: Secondary | ICD-10-CM

## 2022-03-09 DIAGNOSIS — Z3482 Encounter for supervision of other normal pregnancy, second trimester: Secondary | ICD-10-CM

## 2022-03-09 DIAGNOSIS — Z3480 Encounter for supervision of other normal pregnancy, unspecified trimester: Secondary | ICD-10-CM

## 2022-03-09 DIAGNOSIS — F4323 Adjustment disorder with mixed anxiety and depressed mood: Secondary | ICD-10-CM

## 2022-03-09 LAB — POCT URINALYSIS DIPSTICK OB
Glucose, UA: NEGATIVE
POC,PROTEIN,UA: NEGATIVE

## 2022-03-13 ENCOUNTER — Encounter: Payer: Self-pay | Admitting: Obstetrics & Gynecology

## 2022-03-22 ENCOUNTER — Encounter: Payer: Self-pay | Admitting: Obstetrics

## 2022-03-22 ENCOUNTER — Ambulatory Visit (INDEPENDENT_AMBULATORY_CARE_PROVIDER_SITE_OTHER): Payer: Medicaid Other

## 2022-03-22 DIAGNOSIS — Z3482 Encounter for supervision of other normal pregnancy, second trimester: Secondary | ICD-10-CM

## 2022-03-22 DIAGNOSIS — Z3689 Encounter for other specified antenatal screening: Secondary | ICD-10-CM

## 2022-03-22 DIAGNOSIS — Z3A22 22 weeks gestation of pregnancy: Secondary | ICD-10-CM
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
## 2022-04-04 ENCOUNTER — Ambulatory Visit (INDEPENDENT_AMBULATORY_CARE_PROVIDER_SITE_OTHER): Payer: Medicaid Other | Admitting: Obstetrics

## 2022-04-04 VITALS — BP 110/70 | HR 91 | Wt 207.0 lb

## 2022-04-04 DIAGNOSIS — Z3482 Encounter for supervision of other normal pregnancy, second trimester: Secondary | ICD-10-CM

## 2022-04-04 DIAGNOSIS — Z3A24 24 weeks gestation of pregnancy: Secondary | ICD-10-CM

## 2022-04-04 DIAGNOSIS — Z23 Encounter for immunization: Secondary | ICD-10-CM

## 2022-04-04 DIAGNOSIS — Z3480 Encounter for supervision of other normal pregnancy, unspecified trimester: Secondary | ICD-10-CM

## 2022-04-04 LAB — POCT URINALYSIS DIPSTICK OB
Appearance: NORMAL
Bilirubin, UA: NEGATIVE
Blood, UA: NEGATIVE
Glucose, UA: NEGATIVE
Ketones, UA: NEGATIVE
Leukocytes, UA: NEGATIVE
Nitrite, UA: NEGATIVE
POC,PROTEIN,UA: NEGATIVE
Spec Grav, UA: 1.015 (ref 1.010–1.025)
Urobilinogen, UA: 0.2 E.U./dL
pH, UA: 7.5 (ref 5.0–8.0)

## 2022-04-04 NOTE — Progress Notes (Signed)
ROB at [redacted]w[redacted]d. Active baby. Denies ctx, LOF, and vaginal bleeding. Martha [name] has been feeling tired but overall well. Her mood has been a little down lately due to losing several family members in the past month. She stopped her Zoloft because when she misses a dose she gets violently ill. Discussed option of restarting at a lower dose if she desires. Discussed 1-hour glucose at next visit. RTC in 4 weeks.  Lloyd Huger, CNM

## 2022-04-06 ENCOUNTER — Telehealth: Payer: Self-pay

## 2022-04-06 NOTE — Telephone Encounter (Signed)
Faxing to [employer]

## 2022-04-06 NOTE — Telephone Encounter (Signed)
Confirmation 11/2 11:18 36 am faxed

## 2022-04-06 NOTE — Telephone Encounter (Signed)
Pt calling to know what to do in order to get a dental protocol letter.  Pt's # [id number]; Dentist's phone # [place] - fax # [phone]

## 2022-04-29 ENCOUNTER — Encounter: Payer: Self-pay | Admitting: Obstetrics and Gynecology

## 2022-04-29 ENCOUNTER — Observation Stay
Admission: EM | Admit: 2022-04-29 | Discharge: 2022-04-29 | Disposition: A | Discharge status: Home / Self Care | Payer: Medicaid Other | Attending: Obstetrics and Gynecology | Admitting: Obstetrics and Gynecology

## 2022-04-29 ENCOUNTER — Other Ambulatory Visit: Payer: Self-pay

## 2022-04-29 DIAGNOSIS — Z79899 Other long term (current) drug therapy: Secondary | ICD-10-CM | POA: Diagnosis not present

## 2022-04-29 DIAGNOSIS — O26892 Other specified pregnancy related conditions, second trimester: Secondary | ICD-10-CM | POA: Diagnosis present

## 2022-04-29 DIAGNOSIS — R12 Heartburn: Secondary | ICD-10-CM | POA: Diagnosis not present

## 2022-04-29 DIAGNOSIS — Z87891 Personal history of nicotine dependence: Secondary | ICD-10-CM | POA: Insufficient documentation

## 2022-04-29 DIAGNOSIS — R102 Pelvic and perineal pain: Secondary | ICD-10-CM | POA: Insufficient documentation

## 2022-04-29 DIAGNOSIS — R350 Frequency of micturition: Secondary | ICD-10-CM | POA: Diagnosis not present

## 2022-04-29 DIAGNOSIS — Z3A27 27 weeks gestation of pregnancy: Secondary | ICD-10-CM | POA: Insufficient documentation

## 2022-04-29 DIAGNOSIS — R110 Nausea: Secondary | ICD-10-CM | POA: Diagnosis not present

## 2022-04-29 DIAGNOSIS — R1011 Right upper quadrant pain: Secondary | ICD-10-CM | POA: Diagnosis not present

## 2022-04-29 DIAGNOSIS — R109 Unspecified abdominal pain: Secondary | ICD-10-CM | POA: Diagnosis present

## 2022-04-29 DIAGNOSIS — O99891 Other specified diseases and conditions complicating pregnancy: Secondary | ICD-10-CM | POA: Diagnosis not present

## 2022-04-29 DIAGNOSIS — O99213 Obesity complicating pregnancy, third trimester: Secondary | ICD-10-CM

## 2022-04-29 DIAGNOSIS — O26893 Other specified pregnancy related conditions, third trimester: Secondary | ICD-10-CM | POA: Diagnosis present

## 2022-04-29 LAB — URINALYSIS, COMPLETE (UACMP) WITH MICROSCOPIC
Bilirubin Urine: NEGATIVE
Glucose, UA: NEGATIVE mg/dL
Hgb urine dipstick: NEGATIVE
Ketones, ur: NEGATIVE mg/dL
Leukocytes,Ua: NEGATIVE
Nitrite: NEGATIVE
Protein, ur: NEGATIVE mg/dL
Specific Gravity, Urine: 1.021 (ref 1.005–1.030)
pH: 7 (ref 5.0–8.0)

## 2022-04-29 LAB — COMPREHENSIVE METABOLIC PANEL
ALT: 10 U/L (ref 0–44)
AST: 15 U/L (ref 15–41)
Albumin: 3.2 g/dL — ABNORMAL LOW (ref 3.5–5.0)
Alkaline Phosphatase: 71 U/L (ref 38–126)
Anion gap: 8 (ref 5–15)
BUN: 8 mg/dL (ref 6–20)
CO2: 20 mmol/L — ABNORMAL LOW (ref 22–32)
Calcium: 8.6 mg/dL — ABNORMAL LOW (ref 8.9–10.3)
Chloride: 106 mmol/L (ref 98–111)
Creatinine, Ser: 0.42 mg/dL — ABNORMAL LOW (ref 0.44–1.00)
GFR, Estimated: >60 mL/min (ref >60)
Glucose, Bld: 80 mg/dL (ref 70–99)
Potassium: 3.5 mmol/L (ref 3.5–5.1)
Sodium: 134 mmol/L — ABNORMAL LOW (ref 135–145)
Total Bilirubin: 0.4 mg/dL (ref 0.3–1.2)
Total Protein: 6.8 g/dL (ref 6.5–8.1)

## 2022-04-29 LAB — RUPTURE OF MEMBRANE (ROM)PLUS: Rom Plus: NEGATIVE

## 2022-04-29 LAB — WET PREP, GENITAL
Clue Cells Wet Prep HPF POC: NONE SEEN
Sperm: NONE SEEN
Trich, Wet Prep: NONE SEEN
WBC, Wet Prep HPF POC: >=10 — AB (ref <10)
Yeast Wet Prep HPF POC: NONE SEEN

## 2022-04-29 LAB — CBC
HCT: 34.8 % — ABNORMAL LOW (ref 36.0–46.0)
Hemoglobin: 11.8 g/dL — ABNORMAL LOW (ref 12.0–15.0)
MCH: 27.4 pg (ref 26.0–34.0)
MCHC: 33.9 g/dL (ref 30.0–36.0)
MCV: 80.7 fL (ref 80.0–100.0)
Platelets: 238 10*3/uL (ref 150–400)
RBC: 4.31 MIL/uL (ref 3.87–5.11)
RDW: 12.7 % (ref 11.5–15.5)
WBC: 12.2 10*3/uL — ABNORMAL HIGH (ref 4.0–10.5)
nRBC: 0 % (ref 0.0–0.2)

## 2022-04-29 LAB — CHLAMYDIA/NGC RT PCR (ARMC ONLY)
Chlamydia Tr: NOT DETECTED
N gonorrhoeae: NOT DETECTED

## 2022-04-29 NOTE — Discharge Summary (Signed)
Physician Final Progress Note  Patient ID: Martha [name] MRN: [id number] DOB/AGE: Jan 17, 1996 26 y.o.  Admit date: 04/29/2022 Admitting provider: Harlin Heys, MD Discharge date: 04/29/2022   Admission Diagnoses:  1) intrauterine pregnancy at [redacted]w[redacted]d 2) right sided abdominal pain   Discharge Diagnoses:  Principal Problem:   Abdominal pain  Not in labor  History of Present Illness: The patient is a 26y.o. female G3P1011 at 251w6dho presents for right sided abdominal pain. This pain started around Wednesday. It is sharp and "feels like a bruise", it starts on her right side and wraps around to the left side, she does notice her abdomen gets tighter. The pain is "almost always there".  Her sleep has been disturbed by the pain, she will wake up and when she needs to change positions the pain will be there. She has noticed an increase in clear vaginal discharge, yesterday it felt "like I peed on myself", denies any bleeding, vaginal odor or irritation. She has had some pelvic pressure.She had increased urinary frequency and only urinates a small amount she goes-denies dysuria and  states" this does not feel like UTI". . She has had increased nausea-but has not felt the need to take Zofran.  She has had heartburn for a few weeks now, TUMS does not give her relief. The pain she is experiencing improves after eating. Denies fevers, changes to her bowel habits or sick contacts. She is a SAHM.  Denies any recent falls or injuries.  She last had IC a day or sao ago-the pain did not interrupt IC. She still has her appendix and gall bladder.   Past Medical History:  Diagnosis Date   Adjustment disorder with mixed anxiety and depressed mood    Bipolar 1 disorder ([place]   Former smoker    stopped smoking 08/2017; did smoke 3/4 PPD   Hernia of abdominal wall    History of substance use    cocaine and MJ   Migraine with aura    Suprvsn of high risk preg due to social problems, unsp tri  02/01/2018   Clinic [facility] Prenatal Labs  Dating  LMP =12 wk USKorealood type: --/--/O POS  (02/26 1504)   Genetic Screen 1 Screen:    AFP:     Quad:     NIPS: Antibody:Negative (05/07 0000)  Anatomic USK[place]omplete, normal Rubella:    MMR x2 Varicella: Varivax x2  GTT Early:               Third trimester: 89 RPR: Non Reactive (09/20 1106)   Rhogam Not needed HBsAg: Negative (05/07 0000)   TDaP vaccine    10/18/1    Past Surgical History:  Procedure Laterality Date   NO PAST SURGERIES      No current facility-administered medications on file prior to encounter.   Current Outpatient Medications on File Prior to Encounter  Medication Sig Dispense Refill   ferrous sulfate (FERROUSUL) 325 (65 FE) MG tablet Take 1 tablet (325 mg total) by mouth 2 (two) times daily. 60 tablet 4   Pediatric Multivit-Minerals (FLINTSTONES GUMMIES) chewable tablet Chew 2 tablets by mouth daily.     sertraline (ZOLOFT) 50 MG tablet Take 1 tablet (50 mg total) by mouth daily. (Patient not taking: Reported on 04/04/2022) 30 tablet 6    No Known Allergies  Social History   Socioeconomic History   Marital status: Significant Other    Spouse name: JoJenny Reichmanns FOB   Number of  children: 1   Years of education: 11   Highest education level: Not on file  Occupational History   Occupation: homemaker    Comment: cleans on the side  Tobacco Use   Smoking status: Former    Packs/day: 0.25    Types: Cigarettes   Smokeless tobacco: Never  Vaping Use   Vaping Use: Former   Substances: Nicotine   Devices: last vaping mid june 2023  Substance and Sexual Activity   Alcohol use: Not Currently    Comment: last etoh 3 weeks ago   Drug use: Not Currently    Types: Marijuana    Comment: not done in 2 1/2 yrs   Sexual activity: Yes    Partners: Male    Birth control/protection: None    Comment: hx IUD, ocp last used 2 yrs ago and no method since then  Other Topics Concern   Not on file  Social History Narrative   Not on  file   Social Determinants of Health   Financial Resource Strain: [place]  (11/16/2021)   Overall Financial Resource Strain (CARDIA)    Difficulty of Paying Living Expenses: Not hard at all  Food Insecurity: Food Insecurity Present (11/16/2021)   Hunger Vital Sign    Worried About [place] in the Last Year: Never true    [place] in the Last Year: Sometimes true  Transportation Needs: No Transportation Needs (11/16/2021)   PRAPARE - Hydrologist (Medical): No    Lack of Transportation (Non-Medical): No  Physical Activity: Inactive (11/16/2021)   Exercise Vital Sign    Days of Exercise per Week: 0 days    Minutes of Exercise per Session: 0 min  Stress: Stress Concern Present (11/16/2021)   [place]    Feeling of Stress : Very much  Social Connections: Moderately Isolated (11/16/2021)   Social Connection and Isolation Panel [NHANES]    Frequency of Communication with Friends and Family: More than three times a week    Frequency of Social Gatherings with Friends and Family: More than three times a week    Attends Religious Services: Never    Marine scientist or Organizations: No    Attends Archivist Meetings: Never    Marital Status: Living with partner  Intimate Partner Violence: Not At Risk (11/22/2021)   Humiliation, Afraid, Rape, and Kick questionnaire    Fear of Current or Ex-Partner: No    Emotionally Abused: No    Physically Abused: No    Sexually Abused: No    Family History  Problem Relation Age of Onset   Endometriosis Mother    Mental retardation Brother    Gestational diabetes Maternal Aunt    COPD Maternal Grandmother    Hypertension Maternal Grandmother    Diabetes Maternal Grandfather    Hypertension Maternal Grandfather    Heart attack Maternal Grandfather        stents placed   Kidney failure Maternal Grandfather        Stage 4    Autism Cousin        second cousin     ROS see HPI  Physical Exam: BP 115/66 (BP Location: Right Arm)   Pulse 87   Temp 98.4 F (36.9 C) (Oral)   Resp 16   LMP 10/10/2021 (Approximate)   Physical Exam Constitutional:      Appearance: Normal appearance.  Genitourinary:  Vulva normal.     Genitourinary Comments: SSE: cervix visually closed, thin clear discharge present, negative pooling.   Pulmonary:     Effort: Pulmonary effort is normal.  Abdominal:     General: There is no distension.     Tenderness: There is no abdominal tenderness. There is no guarding.     Comments: gravid  Musculoskeletal:     Cervical back: Normal range of motion and neck supple.     Right lower leg: No edema.     Left lower leg: No edema.  Neurological:     General: No focal deficit present.     Mental Status: She is alert.  Skin:    General: Skin is warm.  Psychiatric:        Mood and Affect: Mood normal.   EFM: baseline 140, moderate variability, pos accel (10x10), neg decel  TOCO: rare contraction  Consults: None  Significant Findings/ Diagnostic Studies: neg wet prep, ROM plus, CBC and CMP WNL.  Gc/ct and urine culture pending   Procedures: [place] [facility] Course: The patient was admitted to Labor and Delivery Triage for observation. She was found to have a RNST. Labs WNL, she is not in labor. Urine culture added on to urine specimen. The pain is still present but not as bad. Reviewed this is mostly likely third trimester discomforts. Recommend belly binding and wild yam root. Ok to take OTC antiacid.   Discharge Condition: good  Disposition: Discharge disposition: 01-Home or Self Care       Diet: Regular diet  Discharge Activity: Activity as tolerated   Allergies as of 04/29/2022   No Known Allergies      Medication List     TAKE these medications    ferrous sulfate 325 (65 FE) MG tablet Commonly known as: FerrouSul Take 1 tablet (325 mg total) by mouth 2 (two)  times daily.   Flintstones Gummies chewable tablet Chew 2 tablets by mouth daily.   sertraline 50 MG tablet Commonly known as: Zoloft Take 1 tablet (50 mg total) by mouth daily.         Total time spent taking care of this patient: 20 minutes  Signed: [place], CNM  04/29/2022, 2:04 PM

## 2022-04-29 NOTE — OB Triage Note (Signed)
Pt co of abdominal pain on left side of abdomen. Pain is intermittent. Rates it a 4 out of 10. Denies vaginal bleeding, any changes in vaginal discharge. Elaina Hoops

## 2022-04-29 NOTE — Discharge Instructions (Addendum)
Wild yam root to decrease uterine tightening.

## 2022-04-29 NOTE — Progress Notes (Signed)
Discharge home. Left floor ambulatory. Dineen Kid

## 2022-04-29 NOTE — ED Triage Notes (Signed)
Report given to Heather RN

## 2022-04-30 LAB — URINE CULTURE: Culture: NO GROWTH

## 2022-05-03 ENCOUNTER — Encounter: Payer: Self-pay | Admitting: Advanced Practice Midwife

## 2022-05-03 ENCOUNTER — Other Ambulatory Visit: Payer: Medicaid Other

## 2022-05-03 ENCOUNTER — Ambulatory Visit (INDEPENDENT_AMBULATORY_CARE_PROVIDER_SITE_OTHER): Payer: Medicaid Other | Admitting: Advanced Practice Midwife

## 2022-05-03 VITALS — BP 100/60 | Wt 213.0 lb

## 2022-05-03 DIAGNOSIS — Z130 Encounter for screening for diseases of the blood and blood-forming organs and certain disorders involving the immune mechanism: Secondary | ICD-10-CM

## 2022-05-03 DIAGNOSIS — L659 Nonscarring hair loss, unspecified: Secondary | ICD-10-CM

## 2022-05-03 DIAGNOSIS — Z369 Encounter for antenatal screening, unspecified: Secondary | ICD-10-CM

## 2022-05-03 DIAGNOSIS — Z131 Encounter for screening for diabetes mellitus: Secondary | ICD-10-CM

## 2022-05-03 DIAGNOSIS — E669 Obesity, unspecified: Secondary | ICD-10-CM

## 2022-05-03 DIAGNOSIS — Z3A28 28 weeks gestation of pregnancy: Secondary | ICD-10-CM

## 2022-05-03 DIAGNOSIS — O99213 Obesity complicating pregnancy, third trimester: Secondary | ICD-10-CM

## 2022-05-03 DIAGNOSIS — Z113 Encounter for screening for infections with a predominantly sexual mode of transmission: Secondary | ICD-10-CM

## 2022-05-03 DIAGNOSIS — Z3483 Encounter for supervision of other normal pregnancy, third trimester: Secondary | ICD-10-CM

## 2022-05-03 NOTE — Patient Instructions (Signed)
Perinatal Depression When a woman feels excessive sadness, anger, or anxiety during pregnancy or during the first 12 months after she gives birth, she has a condition called perinatal depression. This can interfere with work, school, relationships, and other everyday activities. If it is not managed properly, it can also interfere with the woman's ability to take care of the baby. Symptoms of perinatal depression may feel worse when living with a newborn. Sometimes, these symptoms are left untreated because they are thought to be normal mood swings during and right after pregnancy. However, if you have intense symptoms of depression that last for more than 2 weeks, it is important to talk with your health care provider. This may be perinatal depression. What are the causes? The exact cause of this condition is not known. Hormonal changes during and after pregnancy may play a role in causing perinatal depression. What increases the risk? You are more likely to develop this condition if: You have a personal or family history of depression, anxiety, or mood disorders. You experience a stressful life event during pregnancy, such as the death of a loved one. You have additional life stress, such as being a single parent. You do not have support from family members or loved ones, or you are in an abusive relationship. You have thyroid problems. What are the signs or symptoms? Symptoms of this condition include: Emotional symptoms, such as: Feeling sad or hopeless. Feelings of guilt. Feeling irritable or overwhelmed. Physical symptoms, such as: Changes with appetite or sleep. Lack of energy or motivation. Persistent headaches or stomach problems. Behavioral symptoms, such as: Difficulty concentrating or completing tasks. Loss of interest in hobbies or relationships. How is this diagnosed? This condition is diagnosed based on a physical exam and mental evaluation. In some cases, your health care  provider may use a depression screening tool. This includes a list of questions that can help a health care provider diagnose depression. You may be referred to a mental health expert who specializes in treating perinatal depression. How is this treated? This condition may be treated with: Talk therapy with a mental health professional. This may be interpersonal psychotherapy, couples therapy, cognitive behavioral therapy, or mother-child bonding therapy. Medicines. Your health care provider will discuss the safety of the medicines prescribed during pregnancy and breastfeeding. Support groups. Brain stimulation or light therapies. Stress reduction therapies, such as mindfulness. Follow these instructions at home: Lifestyle Do not use any products that contain nicotine or tobacco. These products include cigarettes, chewing tobacco, and vaping devices, such as e-cigarettes. If you need help quitting, ask your health care provider. Do not drink alcohol when you are pregnant. It is also safest not to drink alcohol if you are breastfeeding. After your baby is born, if you drink alcohol: Limit how much you have to 0-1 drink a day. Be aware of how much alcohol is in your drink. In the [place] one drink equals one 12 oz bottle of beer (355 mL), one 5 oz glass of wine (148 mL), or one 1 oz glass of hard liquor (44 mL). Consider joining a support group for new mothers. Ask your health care provider for recommendations. Take good care of yourself. Make sure you: Get as much sleep as possible. Talk with your partner about sharing the responsibility of getting up with your baby if possible and sharing child care responsibilities equally. Make sleep a priority. Eat a healthy diet. This includes plenty of fruits and vegetables, whole grains, and lean proteins. Exercise regularly, as  told by your health care provider. Ask your health care provider what exercises are safe for you. Talk with your partner about  making sure you both have opportunities to exercise. General instructions Take over-the-counter and prescription medicines only as told by your health care provider. Talk with your partner or family members about your feelings during pregnancy. Share any concerns, needs, or anxieties that you may have. Do not be afraid to ask for help. Find a mental health professional, if needed. Ask for help with tasks or chores when you need it. Ask friends and family members to provide meals, watch your children, or help with cleaning. Keep all follow-up visits. This is important. Contact a health care provider if: You or people close to you notice that you have symptoms of depression. Your symptoms of depression get worse. You take medicines and have side effects, such as nausea or sleep problems. Get help right away if: You feel like hurting yourself, your baby, or someone else. If you feel like you may hurt yourself or others, or have thoughts about taking your own life, get help right away. You can go to your nearest emergency department or: Call your local emergency services (911 in the [place] Call a suicide crisis helpline, such as the National Suicide Prevention Lifeline, at 706-746-3842 or 988 in the [place] This is open 24 hours a day in the [place] Text the Crisis Text Line at (305) 077-0479 (in the [place] Summary Perinatal depression is when a woman feels excessive sadness, anger, or anxiety during pregnancy or during the first 12 months after she gives birth. If perinatal depression is not managed properly, it can interfere with the woman's ability to take care of the baby. This condition is treated with medicines, talk therapy, stress reduction therapies, or a combination of treatments. Talk with your partner or family members about your feelings. Ask for help when you need it. This information is not intended to replace advice given to you by your health care provider. Make sure you discuss any questions  you have with your health care provider. Document Revised: 12/15/2020 Document Reviewed: 11/14/2019 Elsevier Patient Education  2023 [employer]. Third Trimester of Pregnancy  The third trimester of pregnancy is from week 28 through week 40. This is months 7 through 9. The third trimester is a time when the unborn baby (fetus) is growing rapidly. At the end of the ninth month, the fetus is about 20 inches long and weighs 6-10 pounds. Body changes during your third trimester During the third trimester, your body will continue to go through many changes. The changes vary and generally return to normal after your baby is born. Physical changes Your weight will continue to increase. You can expect to gain 25-35 pounds (11-16 kg) by the end of the pregnancy if you begin pregnancy at a normal weight. If you are underweight, you can expect to gain 28-40 lb (about 13-18 kg), and if you are overweight, you can expect to gain 15-25 lb (about 7-11 kg). You may begin to get stretch marks on your hips, abdomen, and breasts. Your breasts will continue to grow and may hurt. A yellow fluid (colostrum) may leak from your breasts. This is the first milk you are producing for your baby. You may have changes in your hair. These can include thickening of your hair, rapid growth, and changes in texture. Some people also have hair loss during or after pregnancy, or hair that feels dry or thin. Your belly button may stick  out. You may notice more swelling in your hands, face, or ankles. Health changes You may have heartburn. You may have constipation. You may develop hemorrhoids. You may develop swollen, bulging veins (varicose veins) in your legs. You may have increased body aches in the pelvis, back, or thighs. This is due to weight gain and increased hormones that are relaxing your joints. You may have increased tingling or numbness in your hands, arms, and legs. The skin on your abdomen may also feel numb. You  may feel short of breath because of your expanding uterus. Other changes You may urinate more often because the fetus is moving lower into your pelvis and pressing on your bladder. You may have more problems sleeping. This may be caused by the size of your abdomen, an increased need to urinate, and an increase in your body's metabolism. You may notice the fetus "dropping," or moving lower in your abdomen (lightening). You may have increased vaginal discharge. You may notice that you have pain around your pelvic bone as your uterus distends. Follow these instructions at home: Medicines Follow your health care provider's instructions regarding medicine use. Specific medicines may be either safe or unsafe to take during pregnancy. Do not take any medicines unless approved by your health care provider. Take a prenatal vitamin that contains at least 600 micrograms (mcg) of folic acid. Eating and drinking Eat a healthy diet that includes fresh fruits and vegetables, whole grains, good sources of protein such as meat, eggs, or tofu, and low-fat dairy products. Avoid raw meat and unpasteurized juice, milk, and cheese. These carry germs that can harm you and your baby. Eat 4 or 5 small meals rather than 3 large meals a day. You may need to take these actions to prevent or treat constipation: Drink enough fluid to keep your urine pale yellow. Eat foods that are high in fiber, such as beans, whole grains, and fresh fruits and vegetables. Limit foods that are high in fat and processed sugars, such as fried or sweet foods. Activity Exercise only as directed by your health care provider. Most people can continue their usual exercise routine during pregnancy. Try to exercise for 30 minutes at least 5 days a week. Stop exercising if you experience contractions in the uterus. Stop exercising if you develop pain or cramping in the lower abdomen or lower back. Avoid heavy lifting. Do not exercise if it is very  hot or humid or if you are at a high altitude. If you choose to, you may continue to have sex unless your health care provider tells you not to. Relieving pain and discomfort Take frequent breaks and rest with your legs raised (elevated) if you have leg cramps or low back pain. Take warm sitz baths to soothe any pain or discomfort caused by hemorrhoids. Use hemorrhoid cream if your health care provider approves. Wear a supportive bra to prevent discomfort from breast tenderness. If you develop varicose veins: Wear support hose as told by your health care provider. Elevate your feet for 15 minutes, 3-4 times a day. Limit salt in your diet. Safety Talk to your health care provider before traveling far distances. Do not use hot tubs, steam rooms, or saunas. Wear your seat belt at all times when driving or riding in a car. Talk with your health care provider if someone is verbally or physically abusive to you. Preparing for birth To prepare for the arrival of your baby: Take prenatal classes to understand, practice, and ask questions about  labor and delivery. Visit the hospital and tour the maternity area. Purchase a rear-facing car seat and make sure you know how to install it in your car. Prepare the baby's room or sleeping area. Make sure to remove all pillows and stuffed animals from the baby's crib to prevent suffocation. General instructions Avoid cat litter boxes and soil used by cats. These carry germs that can cause birth defects in the baby. If you have a cat, ask someone to clean the litter box for you. Do not douche or use tampons. Do not use scented sanitary pads. Do not use any products that contain nicotine or tobacco, such as cigarettes, e-cigarettes, and chewing tobacco. If you need help quitting, ask your health care provider. Do not use any herbal remedies, illegal drugs, or medicines that were not prescribed to you. Chemicals in these products can harm your baby. Do not  drink alcohol. You will have more frequent prenatal exams during the third trimester. During a routine prenatal visit, your health care provider will do a physical exam, perform tests, and discuss your overall health. Keep all follow-up visits. This is important. Where to find more information American Pregnancy Association: americanpregnancy.org [employer]: https://www.todd-brady.net/ Office on [employer] Health: [employer].nz Contact a health care provider if you have: A fever. Mild pelvic cramps, pelvic pressure, or nagging pain in your abdominal area or lower back. Vomiting or diarrhea. Bad-smelling vaginal discharge or foul-smelling urine. Pain when you urinate. A headache that does not go away when you take medicine. Visual changes or see spots in front of your eyes. Get help right away if: Your water breaks. You have regular contractions less than 5 minutes apart. You have spotting or bleeding from your vagina. You have severe abdominal pain. You have difficulty breathing. You have chest pain. You have fainting spells. You have not felt your baby move for the time period told by your health care provider. You have new or increased pain, swelling, or redness in an arm or leg. Summary The third trimester of pregnancy is from week 28 through week 40 (months 7 through 9). You may have more problems sleeping. This can be caused by the size of your abdomen, an increased need to urinate, and an increase in your body's metabolism. You will have more frequent prenatal exams during the third trimester. Keep all follow-up visits. This is important. This information is not intended to replace advice given to you by your health care provider. Make sure you discuss any questions you have with your health care provider. Document Revised: 10/29/2019 Document Reviewed: 09/04/2019 Elsevier Patient Education  2023 [employer].
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
## 2022-05-04 ENCOUNTER — Other Ambulatory Visit: Payer: Medicaid Other

## 2022-05-05 LAB — 28 WEEK RH+PANEL
Basophils Absolute: 0 10*3/uL (ref 0.0–0.2)
Basos: 0 %
EOS (ABSOLUTE): 0.1 10*3/uL (ref 0.0–0.4)
Eos: 1 %
Gestational Diabetes Screen: 128 mg/dL (ref 70–139)
HIV Screen 4th Generation wRfx: NONREACTIVE
Hematocrit: 33.3 % — ABNORMAL LOW (ref 34.0–46.6)
Hemoglobin: 11.4 g/dL (ref 11.1–15.9)
Immature Grans (Abs): 0 10*3/uL (ref 0.0–0.1)
Immature Granulocytes: 0 %
Lymphocytes Absolute: 2.1 10*3/uL (ref 0.7–3.1)
Lymphs: 19 %
MCH: 28.3 pg (ref 26.6–33.0)
MCHC: 34.2 g/dL (ref 31.5–35.7)
MCV: 83 fL (ref 79–97)
Monocytes Absolute: 0.7 10*3/uL (ref 0.1–0.9)
Monocytes: 6 %
Neutrophils Absolute: 7.8 10*3/uL — ABNORMAL HIGH (ref 1.4–7.0)
Neutrophils: 74 %
Platelets: 240 10*3/uL (ref 150–450)
RBC: 4.03 x10E6/uL (ref 3.77–5.28)
RDW: 13.1 % (ref 11.7–15.4)
RPR Ser Ql: NONREACTIVE
WBC: 10.6 10*3/uL (ref 3.4–10.8)

## 2022-05-05 LAB — TSH: TSH: 1.49 u[IU]/mL (ref 0.450–4.500)

## 2022-05-17 ENCOUNTER — Encounter: Payer: Medicaid Other | Admitting: Obstetrics and Gynecology

## 2022-05-17 ENCOUNTER — Ambulatory Visit (INDEPENDENT_AMBULATORY_CARE_PROVIDER_SITE_OTHER): Payer: Medicaid Other | Admitting: Obstetrics and Gynecology

## 2022-05-17 VITALS — BP 109/77 | HR 93 | Wt 215.5 lb

## 2022-05-17 DIAGNOSIS — Z23 Encounter for immunization: Secondary | ICD-10-CM | POA: Diagnosis not present

## 2022-05-17 DIAGNOSIS — Z3A30 30 weeks gestation of pregnancy: Secondary | ICD-10-CM

## 2022-05-17 DIAGNOSIS — Z3483 Encounter for supervision of other normal pregnancy, third trimester: Secondary | ICD-10-CM

## 2022-05-17 DIAGNOSIS — L659 Nonscarring hair loss, unspecified: Secondary | ICD-10-CM

## 2022-05-17 DIAGNOSIS — Z3480 Encounter for supervision of other normal pregnancy, unspecified trimester: Secondary | ICD-10-CM

## 2022-05-17 LAB — POCT URINALYSIS DIPSTICK OB
Bilirubin, UA: NEGATIVE
Blood, UA: NEGATIVE
Glucose, UA: NEGATIVE
Ketones, UA: NEGATIVE
Leukocytes, UA: NEGATIVE
Nitrite, UA: NEGATIVE
POC,PROTEIN,UA: NEGATIVE
Spec Grav, UA: 1.015 (ref 1.010–1.025)
Urobilinogen, UA: 0.2 E.U./dL
pH, UA: 7 (ref 5.0–8.0)

## 2022-05-17 MED ORDER — CONCEPT DHA 53.5-38-1 MG PO CAPS: 1.0000 | ORAL_CAPSULE | ORAL | 0 refills | Status: AC

## 2022-05-17 NOTE — Progress Notes (Signed)
ROB: Martha [name] is a 26 y.o. G39P1011 female at [redacted]w[redacted]d. Notes she has been moving for the past several days. Also reporting that she is having significant hair shedding for the past few months, comes out in clumps.  Will check labs. Lastly noting mucus discharge over the past month or so. Nothing large but sees it from time to time. Desires samples of prenatal vitamins as current vitamin she is not able to tolerate anymore (Flinstones).  Discussed pain management in labor, is planning on epidural but plans to wait longer before receiving next time. Considering tubal ligation but given handout on all options. Notes having IUD in the past, did like it but expelled after 2 years. Tdap and blood consent done today.  RTC in 2 weeks.

## 2022-05-17 NOTE — Patient Instructions (Signed)
Tdap (Tetanus, Diphtheria, Pertussis) Vaccine: What You Need to Know 1. Why get vaccinated? Tdap vaccine can prevent tetanus, diphtheria, and pertussis. Diphtheria and pertussis spread from person to person. Tetanus enters the body through cuts or wounds. TETANUS (T) causes painful stiffening of the muscles. Tetanus can lead to serious health problems, including being unable to open the mouth, having trouble swallowing and breathing, or death. DIPHTHERIA (D) can lead to difficulty breathing, heart failure, paralysis, or death. PERTUSSIS (aP), also known as "whooping cough," can cause uncontrollable, violent coughing that makes it hard to breathe, eat, or drink. Pertussis can be extremely serious especially in babies and young children, causing pneumonia, convulsions, brain damage, or death. In teens and adults, it can cause weight loss, loss of bladder control, passing out, and rib fractures from severe coughing. 2. Tdap vaccine Tdap is only for children 7 years and older, adolescents, and adults.  Adolescents should receive a single dose of Tdap, preferably at age 10 or 71 years. Pregnant people should get a dose of Tdap during every pregnancy, preferably during the early part of the third trimester, to help protect the newborn from pertussis. Infants are most at risk for severe, life-threatening complications from pertussis. Adults who have never received Tdap should get a dose of Tdap. Also, adults should receive a booster dose of either Tdap or Td (a different vaccine that protects against tetanus and diphtheria but not pertussis) every 10 years, or after 5 years in the case of a severe or dirty wound or burn. Tdap may be given at the same time as other vaccines. 3. Talk with your health care provider Tell your vaccine provider if the person getting the vaccine: Has had an allergic reaction after a previous dose of any vaccine that protects against tetanus, diphtheria, or pertussis, or has any  severe, life-threatening allergies Has had a coma, decreased level of consciousness, or prolonged seizures within 7 days after a previous dose of any pertussis vaccine (DTP, DTaP, or Tdap) Has seizures or another nervous system problem Has ever had Guillain-Barr Syndrome (also called "GBS") Has had severe pain or swelling after a previous dose of any vaccine that protects against tetanus or diphtheria In some cases, your health care provider may decide to postpone Tdap vaccination until a future visit. People with minor illnesses, such as a cold, may be vaccinated. People who are moderately or severely ill should usually wait until they recover before getting Tdap vaccine.  Your health care provider can give you more information. 4. Risks of a vaccine reaction Pain, redness, or swelling where the shot was given, mild fever, headache, feeling tired, and nausea, vomiting, diarrhea, or stomachache sometimes happen after Tdap vaccination. People sometimes faint after medical procedures, including vaccination. Tell your provider if you feel dizzy or have vision changes or ringing in the ears.  As with any medicine, there is a very remote chance of a vaccine causing a severe allergic reaction, other serious injury, or death. 5. What if there is a serious problem? An allergic reaction could occur after the vaccinated person leaves the clinic. If you see signs of a severe allergic reaction (hives, swelling of the face and throat, difficulty breathing, a fast heartbeat, dizziness, or weakness), call 9-1-1 and get the person to the nearest hospital. For other signs that concern you, call your health care provider.  Adverse reactions should be reported to the Vaccine Adverse Event Reporting System (VAERS). Your health care provider will usually file this report, or you  can do it yourself. Visit the VAERS website at www.vaers.hhs.gov or call 1-800-822-7967. VAERS is only for reporting reactions, and VAERS staff  members do not give medical advice. 6. The National Vaccine Injury Compensation Program The National Vaccine Injury Compensation Program (VICP) is a federal program that was created to compensate people who may have been injured by certain vaccines. Claims regarding alleged injury or death due to vaccination have a time limit for filing, which may be as short as two years. Visit the VICP website at www.hrsa.gov/vaccinecompensation or call 1-800-338-2382 to learn about the program and about filing a claim. 7. How can I learn more? Ask your health care provider. Call your local or state health department. Visit the website of the Food and Drug Administration (FDA) for vaccine package inserts and additional information at www.fda.gov/vaccines-blood-biologics/vaccines. Contact the Centers for Disease Control and Prevention (CDC): Call 1-800-232-4636 (1-800-CDC-INFO) or Visit CDC's website at www.cdc.gov/vaccines. Source: CDC Vaccine Information Statement Tdap (Tetanus, Diphtheria, Pertussis) Vaccine (01/09/2020) This same material is available at www.cdc.gov for no charge. This information is not intended to replace advice given to you by your health care provider. Make sure you discuss any questions you have with your health care provider. Document Revised: 04/19/2021 Document Reviewed: 02/21/2021 Elsevier Patient Education  2023 [employer] Inc.  

## 2022-05-17 NOTE — Progress Notes (Signed)
ROB 30.3w: She is doing well. She has been having an increase in discharge that is clear and looks like gel. She has good fetal movement.

## 2022-05-18 ENCOUNTER — Encounter: Payer: Self-pay | Admitting: Obstetrics and Gynecology

## 2022-05-22 ENCOUNTER — Telehealth: Payer: Self-pay

## 2022-05-22 NOTE — Telephone Encounter (Signed)
Martha [name] called triage line, having some cramps, not feeling like herself, not bleeding, pt did check her self she said it was really soft, and feeling opened.   She didn't know if she could come in to be seen or does she need to be checked out at ED

## 2022-05-22 NOTE — Telephone Encounter (Signed)
Called patient back after speaking to [facility],   Patient was advised to take some tylenol for the pain/cramping, increase her water intake, her cervix was checked on 04/29/22 when in the ED and was closed.   Did inform patient if things get worse or she needed a peace of mind to please head to ED to be checked and patient understood.

## 2022-05-24 LAB — ZINC: Zinc: 63 ug/dL (ref 44–115)

## 2022-05-24 LAB — MAGNESIUM: Magnesium: 2.2 mg/dL (ref 1.6–2.3)

## 2022-05-24 LAB — FOLATE: Folate: 8.5 ng/mL (ref >3.0)

## 2022-05-24 LAB — VITAMIN D 25 HYDROXY (VIT D DEFICIENCY, FRACTURES): Vit D, 25-Hydroxy: 25.7 ng/mL — ABNORMAL LOW (ref 30.0–100.0)

## 2022-05-24 LAB — VITAMIN B12: Vitamin B-12: 254 pg/mL (ref 232–1245)

## 2022-05-25 ENCOUNTER — Other Ambulatory Visit: Payer: Self-pay | Admitting: Obstetrics and Gynecology

## 2022-05-25 DIAGNOSIS — E559 Vitamin D deficiency, unspecified: Secondary | ICD-10-CM

## 2022-05-25 MED ORDER — VITAMIN D (ERGOCALCIFEROL) 1.25 MG (50000 UNIT) PO CAPS: 50000.0000 [IU] | ORAL_CAPSULE | ORAL | 0 refills | Status: DC

## 2022-06-05 NOTE — L&D Delivery Note (Signed)
Date of delivery: 07/13/22 Estimated Date of Delivery: 07/23/22 EGA: [redacted]w[redacted]d  Hospital Course summary: Martha [name] is a 27 y.o. F1Q1975 @ [redacted]w[redacted]d who was admitted on 07/13/2022 in active labor.   Delivery Note At 12:55 PM a viable female was delivered via Vaginal, Spontaneous (Presentation:      ).  APGAR: 8, 9; weight  .  pending Placenta status: Spontaneous, Intact.  Cord: 3 vessels with the following complications: None.  Anesthesia: Epidural Episiotomy: None Lacerations:  intact Est. Blood Loss (mL): 100  Mom to postpartum.  Baby to Couplet care / Skin to Skin.  Martha [name] 07/13/2022, 1:24 PM    Delivery Narrative  Martha [name] was complete at 1238 and began pushing at 1245. She delivered a vigorous female infant named Martha [name] at [phone]. Apgars 8,9. The head followed by shoulders which were delivered without difficulty, and the rest of the body. Baby was immediately placed skin to skin on mother's chest. No Nuchal cord was noted. Delayed cord clamping with cord clamped by CNM and cut by FOB. Cord blood was obtained. Placenta was delivered at 1304, primarily by maternal efforts and with some slight cord traction. Placenta was intact, Shultz mechanism, 3 VC. Perineum inspected and found to be intact. AMSTL with IV pitocin per unit protocol. EBL 100. Mother and baby stable.

## 2022-06-07 ENCOUNTER — Encounter: Payer: Self-pay | Admitting: Obstetrics

## 2022-06-07 ENCOUNTER — Ambulatory Visit (INDEPENDENT_AMBULATORY_CARE_PROVIDER_SITE_OTHER): Payer: Medicaid Other | Admitting: Obstetrics

## 2022-06-07 VITALS — BP 121/80 | HR 100 | Wt 218.0 lb

## 2022-06-07 DIAGNOSIS — Z3A33 33 weeks gestation of pregnancy: Secondary | ICD-10-CM

## 2022-06-07 DIAGNOSIS — Z3483 Encounter for supervision of other normal pregnancy, third trimester: Secondary | ICD-10-CM

## 2022-06-07 MED ORDER — PRENATAL VITAMIN/MIN +DHA 27-0.8-200 MG PO CAPS: 30.0000 | ORAL_CAPSULE | ORAL | 8 refills | Status: AC

## 2022-06-07 NOTE — Progress Notes (Signed)
ROB at [redacted]w[redacted]d. Active baby. Martha [name] has been having some BH ctx. Denies LOF and vaginal bleeding. She has been feeling lightheaded often lately. She reports that she does not have access to drinking water and is running out of food. She states that she is not eligible for food stamps until after the baby is born. She also has difficulty with transportation d/t not being able to afford gas. Will contact social worker to find out what resources are available. Martha [name] states that her mood is "all over the place." Wellbutrin has been helpful in the past, and she would like to restart this at 36-37 weeks. She took her vitamin D pills daily instead of weekly and has finished the rx. New rx for prenatal vitamins sent. GBS and GC/chlamydia swabs at next visit. RTC in 2 weeks.  Lloyd Huger, CNM

## 2022-06-14 ENCOUNTER — Encounter: Payer: Self-pay | Admitting: Licensed Practical Nurse

## 2022-06-14 ENCOUNTER — Ambulatory Visit (INDEPENDENT_AMBULATORY_CARE_PROVIDER_SITE_OTHER): Payer: Medicaid Other | Admitting: Licensed Practical Nurse

## 2022-06-14 VITALS — BP 129/83 | HR 92 | Wt 218.0 lb

## 2022-06-14 DIAGNOSIS — Z3A34 34 weeks gestation of pregnancy: Secondary | ICD-10-CM

## 2022-06-14 DIAGNOSIS — Z3483 Encounter for supervision of other normal pregnancy, third trimester: Secondary | ICD-10-CM

## 2022-06-14 DIAGNOSIS — O26843 Uterine size-date discrepancy, third trimester: Secondary | ICD-10-CM

## 2022-06-14 LAB — POCT URINALYSIS DIPSTICK
Bilirubin, UA: NEGATIVE
Blood, UA: NEGATIVE
Glucose, UA: NEGATIVE
Ketones, UA: NEGATIVE
Leukocytes, UA: NEGATIVE
Nitrite, UA: NEGATIVE
Protein, UA: NEGATIVE
Spec Grav, UA: 1.01 (ref 1.010–1.025)
Urobilinogen, UA: 0.2 E.U./dL
pH, UA: 6.5 (ref 5.0–8.0)
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
## 2022-06-21 ENCOUNTER — Ambulatory Visit
Admission: RE | Admit: 2022-06-21 | Discharge: 2022-06-21 | Disposition: A | Discharge status: Home / Self Care | Payer: Medicaid Other | Source: Ambulatory Visit | Attending: Licensed Practical Nurse | Admitting: Licensed Practical Nurse

## 2022-06-21 DIAGNOSIS — O26843 Uterine size-date discrepancy, third trimester: Secondary | ICD-10-CM | POA: Diagnosis present

## 2022-06-22 ENCOUNTER — Encounter: Payer: Medicaid Other | Admitting: Obstetrics and Gynecology

## 2022-06-28 ENCOUNTER — Encounter: Payer: Medicaid Other | Admitting: Obstetrics

## 2022-06-30 ENCOUNTER — Other Ambulatory Visit: Payer: Self-pay

## 2022-06-30 ENCOUNTER — Encounter: Payer: Self-pay | Admitting: Obstetrics and Gynecology

## 2022-06-30 ENCOUNTER — Observation Stay
Admission: EM | Admit: 2022-06-30 | Discharge: 2022-06-30 | Disposition: A | Discharge status: Home / Self Care | Payer: Medicaid Other | Attending: Obstetrics and Gynecology | Admitting: Obstetrics and Gynecology

## 2022-06-30 DIAGNOSIS — O26893 Other specified pregnancy related conditions, third trimester: Secondary | ICD-10-CM | POA: Diagnosis present

## 2022-06-30 DIAGNOSIS — R109 Unspecified abdominal pain: Secondary | ICD-10-CM | POA: Diagnosis not present

## 2022-06-30 DIAGNOSIS — R1010 Upper abdominal pain, unspecified: Secondary | ICD-10-CM | POA: Diagnosis not present

## 2022-06-30 DIAGNOSIS — O99213 Obesity complicating pregnancy, third trimester: Secondary | ICD-10-CM | POA: Diagnosis not present

## 2022-06-30 DIAGNOSIS — Z3A36 36 weeks gestation of pregnancy: Secondary | ICD-10-CM

## 2022-06-30 DIAGNOSIS — E669 Obesity, unspecified: Secondary | ICD-10-CM | POA: Insufficient documentation

## 2022-06-30 DIAGNOSIS — O2693 Pregnancy related conditions, unspecified, third trimester: Principal | ICD-10-CM | POA: Insufficient documentation

## 2022-06-30 MED ORDER — ACETAMINOPHEN 500 MG PO TABS
ORAL_TABLET | ORAL | Status: AC
  Administered 2022-06-30: 1000 mg via ORAL
  Filled 2022-06-30: qty 2

## 2022-06-30 MED ORDER — ACETAMINOPHEN 500 MG PO TABS: 1000.0000 mg | ORAL_TABLET | Freq: Once | ORAL | Status: AC

## 2022-06-30 NOTE — OB Triage Note (Signed)
Pt co Braxton Hicks, vaginal/rectal pressure. Denies vaginal bleeding, LOF. Dineen Kid

## 2022-06-30 NOTE — OB Triage Note (Signed)
Discharged home, ambulatory. Martha [name]

## 2022-06-30 NOTE — Discharge Summary (Signed)
Physician Final Progress Note  Patient ID: Martha [name] MRN: [id number] DOB/AGE: January 30, 1996 27 y.o.  Admit date: 06/30/2022 Admitting provider: Rod Can, CNM Discharge date: 06/30/2022   Admission Diagnoses:  1) intrauterine pregnancy at [redacted]w[redacted]d  2) abdominal pain  Discharge Diagnoses:  Principal Problem:   Labor and delivery, indication for care Active Problems:   Abdominal pain during pregnancy in third trimester   [redacted] weeks gestation of pregnancy    History of Present Illness: The patient is a 27 y.o. female G3P1011 at [redacted]w[redacted]d who presents for low pelvic pain and pressure. She feels sharp pain about 10 times a day. She reports good fetal movement. She denies vaginal bleeding. She denies leakage of amniotic fluid and has had some loss of mucous discharge. She is admitted for observation and placed on monitors. NST reactive. Cervix is 1/50/-3. Irregular contractions are seen on the tracing. She is discharged to home with instructions and precautions.  Past Medical History:  Diagnosis Date   Adjustment disorder with mixed anxiety and depressed mood    Bipolar 1 disorder ([place])    Former smoker    stopped smoking 08/2017; did smoke 3/4 PPD   Hernia of abdominal wall    History of substance use    cocaine and MJ   Migraine with aura    Suprvsn of high risk preg due to social problems, unsp tri 02/01/2018   Clinic [facility] Prenatal Labs  Dating  LMP =12 wk [place] Blood type: --/--/O POS  (02/26 1504)   Genetic Screen 1 Screen:    AFP:     Quad:     NIPS: Antibody:Negative (05/07 0000)  Anatomic [place] Complete, normal Rubella:    MMR x2 Varicella: Varivax x2  GTT Early:               Third trimester: 89 RPR: Non Reactive (09/20 1106)   Rhogam Not needed HBsAg: Negative (05/07 0000)   TDaP vaccine    10/18/1    Past Surgical History:  Procedure Laterality Date   NO PAST SURGERIES      No current facility-administered medications on file prior to encounter.   Current Outpatient  Medications on File Prior to Encounter  Medication Sig Dispense Refill   ferrous sulfate (FERROUSUL) 325 (65 FE) MG tablet Take 1 tablet (325 mg total) by mouth 2 (two) times daily. 60 tablet 4   Pediatric Multivit-Minerals (FLINTSTONES GUMMIES) chewable tablet Chew 2 tablets by mouth daily.      No Known Allergies  Social History   Socioeconomic History   Marital status: Significant Other    Spouse name: Jenny Reichmann is FOB   Number of children: 1   Years of education: 13   Highest education level: Not on file  Occupational History   Occupation: homemaker    Comment: cleans on the side  Tobacco Use   Smoking status: Former    Packs/day: 0.25    Types: Cigarettes   Smokeless tobacco: Never  Vaping Use   Vaping Use: Former   Substances: Nicotine   Devices: last vaping mid june 2023  Substance and Sexual Activity   Alcohol use: Not Currently    Comment: last etoh 3 weeks ago   Drug use: Not Currently    Types: Marijuana    Comment: not done in 2 1/2 yrs   Sexual activity: Yes    Partners: Male    Birth control/protection: None    Comment: hx IUD, ocp last used 2 yrs ago and no  method since then  Other Topics Concern   Not on file  Social History Narrative   Not on file   Social Determinants of Health   Financial Resource Strain: Low Risk  (11/16/2021)   Overall Financial Resource Strain (CARDIA)    Difficulty of Paying Living Expenses: Not hard at all  Food Insecurity: Food Insecurity Present (11/16/2021)   Hunger Vital Sign    Worried About Running Out of Food in the Last Year: Never true    Ran Out of Food in the Last Year: Sometimes true  Transportation Needs: No Transportation Needs (11/16/2021)   PRAPARE - Hydrologist (Medical): No    Lack of Transportation (Non-Medical): No  Physical Activity: Inactive (11/16/2021)   Exercise Vital Sign    Days of Exercise per Week: 0 days    Minutes of Exercise per Session: 0 min  Stress: Stress  Concern Present (11/16/2021)   [place]    Feeling of Stress : Very much  Social Connections: Moderately Isolated (11/16/2021)   Social Connection and Isolation Panel [NHANES]    Frequency of Communication with Friends and Family: More than three times a week    Frequency of Social Gatherings with Friends and Family: More than three times a week    Attends Religious Services: Never    Marine scientist or Organizations: No    Attends Archivist Meetings: Never    Marital Status: Living with partner  Intimate Partner Violence: Not At Risk (11/22/2021)   Humiliation, Afraid, Rape, and Kick questionnaire    Fear of Current or Ex-Partner: No    Emotionally Abused: No    Physically Abused: No    Sexually Abused: No    Family History  Problem Relation Age of Onset   Endometriosis Mother    Mental retardation Brother    Gestational diabetes Maternal Aunt    COPD Maternal Grandmother    Hypertension Maternal Grandmother    Diabetes Maternal Grandfather    Hypertension Maternal Grandfather    Heart attack Maternal Grandfather        stents placed   Kidney failure Maternal Grandfather        Stage 4   Autism Cousin        second cousin     Review of Systems  Constitutional:  Negative for chills and fever.  HENT:  Negative for congestion, ear discharge, ear pain, hearing loss, sinus pain and sore throat.   Eyes:  Negative for blurred vision and double vision.  Respiratory:  Negative for cough, shortness of breath and wheezing.   Cardiovascular:  Negative for chest pain, palpitations and leg swelling.  Gastrointestinal:  Positive for abdominal pain. Negative for blood in stool, constipation, diarrhea, heartburn, melena, nausea and vomiting.  Genitourinary:  Negative for dysuria, flank pain, frequency, hematuria and urgency.  Musculoskeletal:  Negative for back pain, joint pain and myalgias.  Skin:   Negative for itching and rash.  Neurological:  Negative for dizziness, tingling, tremors, sensory change, speech change, focal weakness, seizures, loss of consciousness, weakness and headaches.  Endo/Heme/Allergies:  Negative for environmental allergies. Does not bruise/bleed easily.  Psychiatric/Behavioral:  Negative for depression, hallucinations, memory loss, substance abuse and suicidal ideas. The patient is not nervous/anxious and does not have insomnia.      Physical Exam: BP 125/82 (BP Location: Left Arm)   Pulse (!) 108   Temp 98.7 F (37.1 C) (Oral)  Resp 16   Ht 5\' 3"  (1.6 m)   Wt 99.8 kg   LMP 10/10/2021 (Approximate)   BMI 38.97 kg/m   Constitutional: Well nourished, well developed female in no acute distress.  HEENT: normal Skin: Warm and dry.  Cardiovascular: Regular rate and rhythm.   Extremity:  no edema   Respiratory: Clear to auscultation bilateral. Normal respiratory effort Abdomen: FHT present Back: no CVAT Neuro: DTRs 2+, Cranial nerves grossly intact Psych: Alert and Oriented x3. No memory deficits. Normal mood and affect.  MS: normal gait, normal bilateral lower extremity ROM/strength/stability.  Pelvic exam: (female chaperone present) is not limited by body habitus EGBUS: within normal limits Vagina: within normal limits and with normal mucosa  Cervix: 1/50/-3  Toco: irregular, mild to palpation Fetal well being: 135 bpm moderate variability, +accelerations, -decelerations  Consults: None  Significant Findings/ Diagnostic Studies: none  Procedures: NST  Hospital Course: The patient was admitted to Labor and Delivery Triage for observation.   Discharge Condition: good  Disposition: Discharge disposition: 01-Home or Self Care  Diet: Regular diet  Discharge Activity: Activity as tolerated   Allergies as of 06/30/2022   No Known Allergies      Medication List     STOP taking these medications    Vitamin D (Ergocalciferol) 1.25 MG  (50000 UNIT) Caps capsule Commonly known as: DRISDOL       TAKE these medications    ferrous sulfate 325 (65 FE) MG tablet Commonly known as: FerrouSul Take 1 tablet (325 mg total) by mouth 2 (two) times daily.   Flintstones Gummies chewable tablet Chew 2 tablets by mouth daily.         Total time spent taking care of this patient: 20 minutes  Signed: 07/02/2022, CNM  06/30/2022, 8:05 PM

## 2022-07-01 ENCOUNTER — Other Ambulatory Visit: Payer: Self-pay | Admitting: Licensed Practical Nurse

## 2022-07-01 DIAGNOSIS — Z20828 Contact with and (suspected) exposure to other viral communicable diseases: Secondary | ICD-10-CM

## 2022-07-01 MED ORDER — OSELTAMIVIR PHOSPHATE 75 MG PO CAPS: 75.0000 mg | ORAL_CAPSULE | Freq: Every day | ORAL | 0 refills | Status: DC

## 2022-07-01 NOTE — Progress Notes (Signed)
Pt called answering service, pt was in a car with someone who recently has tested positive for the flu. Pt denies any flu like symptoms.  Tamiflu 75mg  daily x 7 days sen to the pharmacy on file  [place], [facility]  07/01/22  11:50 AM

## 2022-07-03 ENCOUNTER — Encounter: Payer: Self-pay | Admitting: Obstetrics

## 2022-07-03 ENCOUNTER — Other Ambulatory Visit (HOSPITAL_COMMUNITY)
Admission: RE | Admit: 2022-07-03 | Discharge: 2022-07-03 | Disposition: A | Discharge status: Home / Self Care | Payer: Medicaid Other | Source: Ambulatory Visit | Attending: Obstetrics | Admitting: Obstetrics

## 2022-07-03 ENCOUNTER — Ambulatory Visit (INDEPENDENT_AMBULATORY_CARE_PROVIDER_SITE_OTHER): Payer: Medicaid Other | Admitting: Obstetrics

## 2022-07-03 VITALS — BP 130/89 | HR 89 | Wt 222.0 lb

## 2022-07-03 DIAGNOSIS — Z3A37 37 weeks gestation of pregnancy: Secondary | ICD-10-CM | POA: Diagnosis present

## 2022-07-03 DIAGNOSIS — Z3483 Encounter for supervision of other normal pregnancy, third trimester: Secondary | ICD-10-CM

## 2022-07-03 DIAGNOSIS — Z3685 Encounter for antenatal screening for Streptococcus B: Secondary | ICD-10-CM

## 2022-07-03 DIAGNOSIS — Z113 Encounter for screening for infections with a predominantly sexual mode of transmission: Secondary | ICD-10-CM | POA: Insufficient documentation

## 2022-07-03 LAB — POCT URINALYSIS DIPSTICK OB
Bilirubin, UA: NEGATIVE
Blood, UA: NEGATIVE
Glucose, UA: NEGATIVE
Ketones, UA: NEGATIVE
Leukocytes, UA: NEGATIVE
Nitrite, UA: NEGATIVE
POC,PROTEIN,UA: NEGATIVE
Spec Grav, UA: 1.02 (ref 1.010–1.025)
Urobilinogen, UA: 0.2 E.U./dL
pH, UA: 6.5 (ref 5.0–8.0)

## 2022-07-03 MED ORDER — BUPROPION HCL ER (XL) 150 MG PO TB24: 150.0000 mg | ORAL_TABLET | Freq: Every day | ORAL | 6 refills | Status: DC

## 2022-07-03 NOTE — Progress Notes (Signed)
ROB at [redacted]w[redacted]d. Active baby. Denies LOF and vaginal bleeding. Having occasional ctx and lots of pelvic pressure. Koriana was recently exposed to the flu but did not develop any symptoms. She is getting food from the food bank with her MIL. She would like to start Wellbutrin today. Rx for Wellbutrin 150 mg sent; reviewed instructions. GBS and GC/chlamydia swabs self-collected today. Desires SVE: 1/50/-2. [employer] given.      07/03/2022    8:53 AM 02/01/2018    1:04 PM  GAD 7 : Generalized Anxiety Score  Nervous, Anxious, on Edge 1 1  Control/stop worrying 1 2  Worry too much - different things 0 1  Trouble relaxing 1 1  Restless 0 1  Easily annoyed or irritable 1 1  Afraid - awful might happen 0 1  Total GAD 7 Score 4 8  Anxiety Difficulty Somewhat difficult Somewhat difficult        07/03/2022    8:52 AM 11/22/2021    9:20 AM 02/01/2018    1:04 PM  Depression screen PHQ 2/9  Decreased Interest 1 1 1   Down, Depressed, Hopeless 1 1 0  PHQ - 2 Score 2 2 1   Altered sleeping 1  1  Tired, decreased energy 2  2  Change in appetite 1  1  Feeling bad or failure about yourself  0  0  Trouble concentrating 2  0  Moving slowly or fidgety/restless 2  1  Suicidal thoughts 0  0  PHQ-9 Score 10  6  Difficult doing work/chores Somewhat difficult  Not difficult at all   RTC in one week.  Lurlean Horns, CNM

## 2022-07-03 NOTE — Addendum Note (Signed)
Addended by: Landis Gandy on: 07/03/2022 10:38 AM   Modules accepted: Orders

## 2022-07-04 LAB — CERVICOVAGINAL ANCILLARY ONLY
Chlamydia: NEGATIVE
Comment: NEGATIVE
Comment: NORMAL
Neisseria Gonorrhea: NEGATIVE

## 2022-07-05 LAB — STREP GP B NAA: Strep Gp B NAA: NEGATIVE

## 2022-07-10 ENCOUNTER — Ambulatory Visit (INDEPENDENT_AMBULATORY_CARE_PROVIDER_SITE_OTHER): Payer: Medicaid Other | Admitting: Obstetrics

## 2022-07-10 VITALS — BP 123/85 | HR 80 | Wt 221.4 lb

## 2022-07-10 DIAGNOSIS — Z3483 Encounter for supervision of other normal pregnancy, third trimester: Secondary | ICD-10-CM

## 2022-07-10 DIAGNOSIS — Z3A38 38 weeks gestation of pregnancy: Secondary | ICD-10-CM

## 2022-07-10 DIAGNOSIS — Z3480 Encounter for supervision of other normal pregnancy, unspecified trimester: Secondary | ICD-10-CM

## 2022-07-10 LAB — POCT URINALYSIS DIPSTICK OB
Bilirubin, UA: NEGATIVE
Blood, UA: NEGATIVE
Glucose, UA: NEGATIVE
Ketones, UA: NEGATIVE
Leukocytes, UA: NEGATIVE
Nitrite, UA: NEGATIVE
POC,PROTEIN,UA: NEGATIVE
Spec Grav, UA: 1.01 (ref 1.010–1.025)
Urobilinogen, UA: 1 E.U./dL
pH, UA: 6 (ref 5.0–8.0)

## 2022-07-10 NOTE — Progress Notes (Signed)
ROB at [redacted]w[redacted]d. Baby is active. Having some occasional contractions and increased d/c. Denies LOF and vaginal bleeding. Martha [name] is not feeling a big improvement from the Wellbutrin yet, but she reports that is feeling less tired. Desires SVE and sweep today: 2/50/-3, swept. Reviewed when to go to the hospital. RTC in 1 week.  Lurlean Horns, CNM

## 2022-07-12 ENCOUNTER — Telehealth: Payer: Self-pay

## 2022-07-12 NOTE — Telephone Encounter (Signed)
Patient contacted office requesting a call back, patient states that she has an appt scheduled for Tuesday 2/14 at 10:45Am and will need to reschedule appointment. KW

## 2022-07-12 NOTE — Telephone Encounter (Signed)
Patient was contacted and rescheduled for 2/13 with DJE at 3:15 pm.

## 2022-07-13 ENCOUNTER — Other Ambulatory Visit: Payer: Self-pay

## 2022-07-13 ENCOUNTER — Inpatient Hospital Stay: Payer: Medicaid Other | Admitting: Anesthesiology

## 2022-07-13 ENCOUNTER — Encounter: Payer: Self-pay | Admitting: Obstetrics and Gynecology

## 2022-07-13 ENCOUNTER — Inpatient Hospital Stay
Admission: EM | Admit: 2022-07-13 | Discharge: 2022-07-14 | DRG: 807 | Disposition: A | Discharge status: Home / Self Care | Payer: Medicaid Other | Attending: Advanced Practice Midwife | Admitting: Advanced Practice Midwife

## 2022-07-13 DIAGNOSIS — F32A Depression, unspecified: Secondary | ICD-10-CM | POA: Diagnosis present

## 2022-07-13 DIAGNOSIS — F419 Anxiety disorder, unspecified: Secondary | ICD-10-CM | POA: Diagnosis present

## 2022-07-13 DIAGNOSIS — Z87891 Personal history of nicotine dependence: Secondary | ICD-10-CM | POA: Diagnosis not present

## 2022-07-13 DIAGNOSIS — O99213 Obesity complicating pregnancy, third trimester: Secondary | ICD-10-CM

## 2022-07-13 DIAGNOSIS — O09293 Supervision of pregnancy with other poor reproductive or obstetric history, third trimester: Secondary | ICD-10-CM | POA: Diagnosis not present

## 2022-07-13 DIAGNOSIS — F418 Other specified anxiety disorders: Secondary | ICD-10-CM | POA: Diagnosis not present

## 2022-07-13 DIAGNOSIS — E669 Obesity, unspecified: Secondary | ICD-10-CM | POA: Diagnosis not present

## 2022-07-13 DIAGNOSIS — Z3A38 38 weeks gestation of pregnancy: Secondary | ICD-10-CM | POA: Diagnosis not present

## 2022-07-13 DIAGNOSIS — O99344 Other mental disorders complicating childbirth: Secondary | ICD-10-CM | POA: Diagnosis present

## 2022-07-13 DIAGNOSIS — O99214 Obesity complicating childbirth: Principal | ICD-10-CM | POA: Diagnosis present

## 2022-07-13 DIAGNOSIS — O26893 Other specified pregnancy related conditions, third trimester: Secondary | ICD-10-CM | POA: Diagnosis present

## 2022-07-13 LAB — TYPE AND SCREEN
ABO/RH(D): O POS
Antibody Screen: NEGATIVE

## 2022-07-13 LAB — CBC
HCT: 35.6 % — ABNORMAL LOW (ref 36.0–46.0)
Hemoglobin: 12 g/dL (ref 12.0–15.0)
MCH: 26 pg (ref 26.0–34.0)
MCHC: 33.7 g/dL (ref 30.0–36.0)
MCV: 77.2 fL — ABNORMAL LOW (ref 80.0–100.0)
Platelets: 309 10*3/uL (ref 150–400)
RBC: 4.61 MIL/uL (ref 3.87–5.11)
RDW: 13.2 % (ref 11.5–15.5)
WBC: 15.1 10*3/uL — ABNORMAL HIGH (ref 4.0–10.5)
nRBC: 0 % (ref 0.0–0.2)

## 2022-07-13 MED ORDER — ONDANSETRON HCL 4 MG PO TABS: 4.0000 mg | ORAL_TABLET | ORAL | Status: DC | PRN

## 2022-07-13 MED ORDER — PRENATAL MULTIVITAMIN CH
1.0000 | ORAL_TABLET | Freq: Every day | ORAL | Status: DC
  Administered 2022-07-14: 1 via ORAL
  Filled 2022-07-13: qty 1

## 2022-07-13 MED ORDER — OXYTOCIN-SODIUM CHLORIDE 30-0.9 UT/500ML-% IV SOLN
2.5000 [IU]/h | INTRAVENOUS | Status: DC
  Filled 2022-07-13: qty 500

## 2022-07-13 MED ORDER — TETANUS-DIPHTH-ACELL PERTUSSIS 5-2.5-18.5 LF-MCG/0.5 IM SUSY: 0.5000 mL | PREFILLED_SYRINGE | Freq: Once | INTRAMUSCULAR | Status: DC

## 2022-07-13 MED ORDER — LIDOCAINE HCL (PF) 1 % IJ SOLN
INTRAMUSCULAR | Status: DC | PRN
  Administered 2022-07-13: 3 mL

## 2022-07-13 MED ORDER — FENTANYL-BUPIVACAINE-NACL 0.5-0.125-0.9 MG/250ML-% EP SOLN
EPIDURAL | Status: DC | PRN
  Administered 2022-07-13: 12 mL/h via EPIDURAL

## 2022-07-13 MED ORDER — OXYTOCIN BOLUS FROM INFUSION: 333.0000 mL | Freq: Once | INTRAVENOUS | Status: DC

## 2022-07-13 MED ORDER — ACETAMINOPHEN 325 MG PO TABS: 650.0000 mg | ORAL_TABLET | ORAL | Status: DC | PRN

## 2022-07-13 MED ORDER — FAMOTIDINE 20 MG PO TABS: 40.0000 mg | ORAL_TABLET | Freq: Once | ORAL | Status: DC

## 2022-07-13 MED ORDER — LIDOCAINE-EPINEPHRINE (PF) 1.5 %-1:200000 IJ SOLN
INTRAMUSCULAR | Status: DC | PRN
  Administered 2022-07-13: 3 mL via PERINEURAL

## 2022-07-13 MED ORDER — BENZOCAINE-MENTHOL 20-0.5 % EX AERO
1.0000 | INHALATION_SPRAY | CUTANEOUS | Status: DC | PRN
  Administered 2022-07-13: 1 via TOPICAL

## 2022-07-13 MED ORDER — DIBUCAINE (PERIANAL) 1 % EX OINT
1.0000 | TOPICAL_OINTMENT | CUTANEOUS | Status: DC | PRN
  Administered 2022-07-14: 1 via RECTAL
  Filled 2022-07-13: qty 28

## 2022-07-13 MED ORDER — LACTATED RINGERS IV SOLN: 500.0000 mL | INTRAVENOUS | Status: DC | PRN

## 2022-07-13 MED ORDER — BUPROPION HCL ER (XL) 150 MG PO TB24
150.0000 mg | ORAL_TABLET | Freq: Every day | ORAL | Status: DC
  Administered 2022-07-13 – 2022-07-14 (×2): 150 mg via ORAL
  Filled 2022-07-13 (×2): qty 1

## 2022-07-13 MED ORDER — ONDANSETRON HCL 4 MG/2ML IJ SOLN: 4.0000 mg | Freq: Four times a day (QID) | INTRAMUSCULAR | Status: DC | PRN

## 2022-07-13 MED ORDER — DIPHENHYDRAMINE HCL 25 MG PO CAPS: 25.0000 mg | ORAL_CAPSULE | Freq: Four times a day (QID) | ORAL | Status: DC | PRN

## 2022-07-13 MED ORDER — LACTATED RINGERS IV SOLN: INTRAVENOUS | Status: DC

## 2022-07-13 MED ORDER — IBUPROFEN 600 MG PO TABS
600.0000 mg | ORAL_TABLET | Freq: Four times a day (QID) | ORAL | Status: DC
  Administered 2022-07-13 – 2022-07-14 (×3): 600 mg via ORAL
  Filled 2022-07-13 (×4): qty 1

## 2022-07-13 MED ORDER — OXYCODONE-ACETAMINOPHEN 5-325 MG PO TABS: 1.0000 | ORAL_TABLET | ORAL | Status: DC | PRN

## 2022-07-13 MED ORDER — WITCH HAZEL-GLYCERIN EX PADS
1.0000 | MEDICATED_PAD | CUTANEOUS | Status: DC | PRN
  Administered 2022-07-14: 1 via TOPICAL
  Filled 2022-07-13: qty 100

## 2022-07-13 MED ORDER — FENTANYL-BUPIVACAINE-NACL 0.5-0.125-0.9 MG/250ML-% EP SOLN
EPIDURAL | Status: AC
  Filled 2022-07-13: qty 250

## 2022-07-13 MED ORDER — BENZOCAINE-MENTHOL 20-0.5 % EX AERO
INHALATION_SPRAY | CUTANEOUS | Status: AC
  Filled 2022-07-13: qty 56

## 2022-07-13 MED ORDER — AMMONIA AROMATIC IN INHA
RESPIRATORY_TRACT | Status: AC
  Filled 2022-07-13: qty 10

## 2022-07-13 MED ORDER — SIMETHICONE 80 MG PO CHEW: 80.0000 mg | CHEWABLE_TABLET | ORAL | Status: DC | PRN

## 2022-07-13 MED ORDER — SENNOSIDES-DOCUSATE SODIUM 8.6-50 MG PO TABS
2.0000 | ORAL_TABLET | Freq: Every day | ORAL | Status: DC
  Administered 2022-07-14: 2 via ORAL
  Filled 2022-07-13 (×2): qty 2

## 2022-07-13 MED ORDER — COCONUT OIL OIL
1.0000 | TOPICAL_OIL | Status: DC | PRN
  Filled 2022-07-13: qty 7.5

## 2022-07-13 MED ORDER — METOCLOPRAMIDE HCL 10 MG PO TABS: 10.0000 mg | ORAL_TABLET | Freq: Once | ORAL | Status: DC

## 2022-07-13 MED ORDER — BUPIVACAINE HCL (PF) 0.25 % IJ SOLN
INTRAMUSCULAR | Status: DC | PRN
  Administered 2022-07-13: 4 mL via EPIDURAL
  Administered 2022-07-13: 3 mL via EPIDURAL

## 2022-07-13 MED ORDER — WITCH HAZEL-GLYCERIN EX PADS
MEDICATED_PAD | CUTANEOUS | Status: AC
  Filled 2022-07-13: qty 100

## 2022-07-13 MED ORDER — OXYTOCIN 10 UNIT/ML IJ SOLN
INTRAMUSCULAR | Status: AC
  Filled 2022-07-13: qty 2

## 2022-07-13 MED ORDER — ONDANSETRON HCL 4 MG/2ML IJ SOLN: 4.0000 mg | INTRAMUSCULAR | Status: DC | PRN

## 2022-07-13 MED ORDER — ZOLPIDEM TARTRATE 5 MG PO TABS: 5.0000 mg | ORAL_TABLET | Freq: Every evening | ORAL | Status: DC | PRN

## 2022-07-13 MED ORDER — LIDOCAINE HCL (PF) 1 % IJ SOLN
30.0000 mL | INTRAMUSCULAR | Status: DC | PRN
  Filled 2022-07-13: qty 30

## 2022-07-13 MED ORDER — OXYCODONE-ACETAMINOPHEN 5-325 MG PO TABS: 2.0000 | ORAL_TABLET | ORAL | Status: DC | PRN

## 2022-07-13 NOTE — Discharge Summary (Signed)
OB Discharge Summary     Patient Name: Martha [name] DOB: 1995/12/22 MRN: [id number]  Date of admission: 07/13/2022 Delivering MD: Imagene Riches, CNM  Date of Delivery: 07/14/2022  Date of discharge: 07/14/2022  Admitting diagnosis: Labor and delivery, indication for care [O75.9] Intrauterine pregnancy: [redacted]w[redacted]d    Secondary diagnosis:  obesity, anxiety/depression     Discharge diagnosis: Term Pregnancy Delivered                                                                                                Post partum procedures: none  Augmentation:  none  Complications: None  Hospital course:  Onset of Labor With Vaginal Delivery      27y.o. yo [id number] admitted in Active Labor on 07/13/2022. Labor course was uncomplicated, had epidural for pain management.  Membrane Rupture Time/Date: 11:00 AM ,07/13/2022   Delivery Method:Vaginal, Spontaneous  Episiotomy: None  EBL 10082macerations:   intact Patient had an uncomplicated postpartum course . She is ambulating, tolerating a regular diet, passing flatus, and urinating well. Patient is discharged home in stable condition on 07/14/22.  Newborn Data: Birth date:07/13/2022  Birth time:12:55 PM  Gender:Female  Living status:Living  Apgars:8 ,9  Weight:3310 g   Subjective:   Physical exam  Vitals:   07/13/22 1928 07/13/22 2254 07/14/22 0257 07/14/22 0735  BP: 115/75 137/81 119/74 109/73  Pulse: 87 78 79   Resp: 18 18 18 20  $ Temp: 98.9 F (37.2 C) 98.4 F (36.9 C) 97.7 F (36.5 C) 98.4 F (36.9 C)  TempSrc: Oral Oral Oral Oral  SpO2: 100% 99% 99%   Weight:      Height:       General: alert, cooperative, and no distress Breast: soft, non-tender, nipples without breakdown Lochia: appropriate Uterine Fundus: firm Perineum: no erythema or foul odor discharge, minimal edema DVT Evaluation: No evidence of DVT seen on physical exam. Negative Homan's sign. No cords or calf tenderness.  Labs: Lab  Results  Component Value Date   WBC 13.7 (H) 07/14/2022   HGB 10.9 (L) 07/14/2022   HCT 33.2 (L) 07/14/2022   MCV 79.4 (L) 07/14/2022   PLT 256 07/14/2022    Discharge instruction: in After Visit Summary.  Medications:  Allergies as of 07/14/2022   No Known Allergies      Medication List     STOP taking these medications    oseltamivir 75 MG capsule Commonly known as: Tamiflu       TAKE these medications    buPROPion 150 MG 24 hr tablet Commonly known as: Wellbutrin XL Take 1 tablet (150 mg total) by mouth daily.   ferrous sulfate 325 (65 FE) MG tablet Commonly known as: FerrouSul Take 1 tablet (325 mg total) by mouth 2 (two) times daily.   Flintstones Gummies chewable tablet Chew 2 tablets by mouth daily.   ibuprofen 600 MG tablet Commonly known as: ADVIL Take 1 tablet (600 mg total) by mouth every 6 (six) hours.         Activity: Advance as tolerated. Pelvic rest for  6 weeks.   Outpatient follow up:  Follow-up [facility] at [facility] Follow up.   Specialty: Obstetrics and Gynecology Why: make a telehealth visit at two weeks postpartum and an in person visit at four weeks postpartum with Dr. Marcelline Mates to discuss tubal ligation. Contact information: [address] [place] [id number] [phone]                  Postpartum contraception: Tubal Ligation Rhogam Given postpartum: NA Rubella vaccine given postpartum: not indicated Varicella vaccine given postpartum: not indicated TDaP given antepartum or postpartum: received antenatally on 05/17/22  Newborn Data: Live born female  Birth Weight:  7lbs 4.8 oz APGAR: 8, 9  Newborn Delivery   Birth date/time: 07/13/2022 12:55:00 Delivery type: Vaginal, Spontaneous       Baby Feeding: Breast  Disposition:home with mother  Imagene Riches, CNM  07/14/2022 11:32 AM   07/14/2022 11:29 AM

## 2022-07-13 NOTE — Anesthesia Procedure Notes (Signed)
Epidural Patient location during procedure: OB Start time: 07/13/2022 7:45 AM  Staffing Performed: resident/CRNA   Preanesthetic Checklist Completed: patient identified, IV checked, site marked, risks and benefits discussed, surgical consent, monitors and equipment checked, pre-op evaluation and timeout performed  Epidural Patient position: sitting Prep: ChloraPrep Patient monitoring: heart rate, continuous pulse ox and blood pressure Approach: midline Location: L3-L4 Injection technique: LOR saline  Needle:  Needle type: Tuohy  Needle gauge: 17 G Needle length: 9 cm Needle insertion depth: 7 cm Catheter type: closed end flexible Catheter size: 19 Gauge Catheter at skin depth: 12 cm Test dose: negative and 1.5% lidocaine with Epi 1:200 K  Assessment Events: blood not aspirated, no cerebrospinal fluid, injection not painful, no injection resistance, no paresthesia and negative IV test  Additional Notes Risks discussed with patient.  Consent obtained.  Pt tolerated well.  Atraumatic attempt x1.

## 2022-07-13 NOTE — Progress Notes (Signed)
  Labor Progress Note   27 y.o. G3P1011 @ [redacted]w[redacted]d   Subjective:  Is comfortable with epidural, not feeling contractions.  Objective:  BP 138/83   Pulse 81   Temp 98.2 F (36.8 C) (Oral)   Resp 19   Ht 5\' 3"  (1.6 m)   Wt 100.2 kg   LMP 10/10/2021 (Approximate)   SpO2 96%   BMI 39.15 kg/m  Abd: gravid SVE: 8cm/100%/-1  EFM: 130, mod variability, pos accels, no decels Toco: Ucs q4-52min   Assessment  G3P1011 @ [redacted]w[redacted]d transition VSS FHR Cat I   Plan:   1. Expectant management, anticipate NSVB soon  All discussed with patient  Maggie Font CNM, [facility] 07/13/2022  10:13 AM

## 2022-07-13 NOTE — Anesthesia Preprocedure Evaluation (Signed)
Anesthesia Evaluation  Patient identified by MRN, date of birth, ID band Patient awake    History of Anesthesia Complications Negative for: history of anesthetic complications  Airway Mallampati: II  TM Distance: >3 FB Neck ROM: Full    Dental  (+) Teeth Intact   Pulmonary former smoker          Cardiovascular negative cardio ROS  Rate:Normal     Neuro/Psych  Headaches PSYCHIATRIC DISORDERS   Bipolar Disorder      GI/Hepatic Neg liver ROS,GERD  ,,  Endo/Other  negative endocrine ROS    Renal/GU negative Renal ROS  negative genitourinary   Musculoskeletal negative musculoskeletal ROS (+)    Abdominal   Peds  Hematology negative hematology ROS (+)   Anesthesia Other Findings   Reproductive/Obstetrics (+) Pregnancy                             Anesthesia Physical Anesthesia Plan  ASA: 2  Anesthesia Plan: Epidural   Post-op Pain Management:    Induction:   PONV Risk Score and Plan:   Airway Management Planned:   Additional Equipment:   Intra-op Plan:   Post-operative Plan:   Informed Consent: I have reviewed the patients History and Physical, chart, labs and discussed the procedure including the risks, benefits and alternatives for the proposed anesthesia with the patient or authorized representative who has indicated his/her understanding and acceptance.       Plan Discussed with:   Anesthesia Plan Comments:        Anesthesia Quick Evaluation

## 2022-07-13 NOTE — OB Triage Note (Signed)
Patient is a G3P1001 at 38wks4d coming in for painful ctx that have been going on since 2am per patient report. Patient states she had her membrane swept recently. Patient reports seeing bloody show at home and a small amount of discharge. Patient denies headaches, blurry vision, and bright red bleeding. Upon SVE patient is 4cm/60/-3. Initial FHT is 125bpm. Will report triage to midwife on call.
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
## 2022-07-14 ENCOUNTER — Encounter
Admission: EM | Disposition: A | Discharge status: Home / Self Care | Payer: Self-pay | Source: Home / Self Care | Attending: Advanced Practice Midwife

## 2022-07-14 LAB — CBC
HCT: 33.2 % — ABNORMAL LOW (ref 36.0–46.0)
Hemoglobin: 10.9 g/dL — ABNORMAL LOW (ref 12.0–15.0)
MCH: 26.1 pg (ref 26.0–34.0)
MCHC: 32.8 g/dL (ref 30.0–36.0)
MCV: 79.4 fL — ABNORMAL LOW (ref 80.0–100.0)
Platelets: 256 10*3/uL (ref 150–400)
RBC: 4.18 MIL/uL (ref 3.87–5.11)
RDW: 13.5 % (ref 11.5–15.5)
WBC: 13.7 10*3/uL — ABNORMAL HIGH (ref 4.0–10.5)
nRBC: 0 % (ref 0.0–0.2)

## 2022-07-14 LAB — RPR: RPR Ser Ql: NONREACTIVE

## 2022-07-14 SURGERY — LIGATION, FALLOPIAN TUBE, POSTPARTUM
Anesthesia: General | Laterality: Bilateral

## 2022-07-14 MED ORDER — IBUPROFEN 600 MG PO TABS: 600.0000 mg | ORAL_TABLET | Freq: Four times a day (QID) | ORAL | 0 refills | Status: DC
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
## 2022-07-14 NOTE — Progress Notes (Signed)
D/c home via w/c to car   Verb understanding of d/c instructions

## 2022-07-14 NOTE — Anesthesia Postprocedure Evaluation (Signed)
Anesthesia Post Note  Patient: Martha [name]  Procedure(s) Performed: AN AD [place]  Patient location during evaluation: Mother Baby Anesthesia Type: Epidural Level of consciousness: awake and alert Pain management: pain level controlled Vital Signs Assessment: post-procedure vital signs reviewed and stable Respiratory status: spontaneous breathing, nonlabored ventilation and respiratory function stable Cardiovascular status: stable Postop Assessment: no headache, no backache and epidural receding Anesthetic complications: no   No notable events documented.   Last Vitals:  Vitals:   07/14/22 0257 07/14/22 0735  BP: 119/74 109/73  Pulse: 79   Resp: 18 20  Temp: 36.5 C 36.9 C  SpO2: 99%     Last Pain:  Vitals:   07/14/22 0735  TempSrc: Oral  PainSc:                  Norm Salt

## 2022-07-14 NOTE — Lactation Note (Addendum)
This note was copied from a baby's chart. Lactation Consultation Note  Patient Name: Girl Lianette Rotenberry [id number] Date: 07/14/2022 Reason for consult: Initial assessment;Mother's request;Early term 37-38.6wks;Breastfeeding assistance;RN request Age:27 hours  Maternal Data This is mom's 2nd baby, SVD. Mom is an experienced breastfeeding mother. Mom with history of bipolar 1 disorder, adjustment disorder with mixed anxiety and depressed mood per chart review.  On initial consult today mom with sore nipples. Assisted mom with breastfeeding, and management of sore nipples. Has patient been taught Hand Expression?: Yes Does the patient have breastfeeding experience prior to this delivery?: Yes How long did the patient breastfeed?: 10 months  Feeding Mother's Current Feeding Choice: Breast Milk Assisted mom with tips and strategies to maximize position and latch techniques. Mom with compression stripe on right nipple. Provided mom with coconut oil post breastfeeding. Mom was able to identify swallows and when the baby was actively feeding vs. pacifying.  LATCH Score Latch: Grasps breast easily, tongue down, lips flanged, rhythmical sucking.  Audible Swallowing: Spontaneous and intermittent  Type of Nipple: Everted at rest and after stimulation  Comfort (Breast/Nipple): Filling, red/small blisters or bruises, mild/mod discomfort  Hold (Positioning): Assistance needed to correctly position infant at breast and maintain latch.  LATCH Score: 8    Interventions Interventions: Breast feeding basics reviewed;Assisted with latch;Breast massage;Hand express;Breast compression;Adjust position;Support pillows;Position options;Coconut oil;Education Recommended mom start with least sore side first for a few feedings in a row. Recommended alternating positions, ie. use of the football hold. Recommended mom use breast massage and tactile stimulation as needed to encourage baby to actively  feed.  Discharge Discharge Education: Engorgement and breast care;Warning signs for feeding baby;Outpatient recommendation Pump: Manual  Consult Status Consult Status: PRN  Update provided to care nurse.  Jonna Karmah Potocki 07/14/2022, 10:33 AM

## 2022-07-18 ENCOUNTER — Encounter: Payer: Medicaid Other | Admitting: Certified Nurse Midwife

## 2022-07-18 ENCOUNTER — Encounter: Payer: Medicaid Other | Admitting: Obstetrics and Gynecology

## 2022-07-31 ENCOUNTER — Encounter: Payer: Self-pay | Admitting: Licensed Practical Nurse

## 2022-07-31 ENCOUNTER — Telehealth: Payer: Self-pay

## 2022-07-31 ENCOUNTER — Other Ambulatory Visit: Payer: Self-pay

## 2022-07-31 ENCOUNTER — Telehealth (INDEPENDENT_AMBULATORY_CARE_PROVIDER_SITE_OTHER): Payer: Medicaid Other | Admitting: Licensed Practical Nurse

## 2022-07-31 DIAGNOSIS — Z1332 Encounter for screening for maternal depression: Secondary | ICD-10-CM | POA: Diagnosis not present

## 2022-07-31 MED ORDER — OSELTAMIVIR PHOSPHATE 75 MG PO CAPS: 75.0000 mg | ORAL_CAPSULE | Freq: Two times a day (BID) | ORAL | 0 refills | Status: AC

## 2022-07-31 NOTE — Telephone Encounter (Signed)
Pt calling; hsb tested positive for the flu; she has tamiflu from before; can she have a refill?  [phone] Pt had tamiflu about a month-a month and a half ago and finished it.  Her sxs are H/A, congestion, nausea, low grade fever - the highest it's been is 99 point something.  Adv will send in tamiflu rx, to stay hydrated and rest, can take two e.s. tylenol q6h while awake and drink some caffeine which pt states she stopped 2wks ago.  Adv is allowed 16oz incl chocolate.  Pt appreciative.

## 2022-07-31 NOTE — Progress Notes (Signed)
Virtual Visit via Video Note  I connected with Martha [name] on 07/31/22 at  4:15 PM EST by a video enabled telemedicine application and verified that I am speaking with the correct person using two identifiers.  Location: Patient: in her home in [place], [place] Provider: in the office in [place], [place]    I discussed the limitations of evaluation and management by telemedicine and the availability of in person appointments. The patient expressed understanding and agreed to proceed.  History of Present Illness: SVB 2/8 with Martha [name], intact perineum,  "[name]"  -I am still bleeding, in the past week passed some long clots, bleeding like a period today.  -No concerns with voiding stooling or perineum -Sleep: good: "she sleeps through the night", infant eats at 34, wakes btwnen 2-3 then wakes at 7 -Breastfeeding going better compared to hospital, latch is really comfortable -Appetite is fine, a little low, does not have a taste for certain foods  - husband back at work,  -6 y/o at home, loves the baby -Mood: a lot better since starting medicine, started 3 weeks ago  -Family is complete, would like to have a tubal ligation  -Has been walking with the dog -Currently has a temp of 99 and runny nose, denies any tenderness or redness in her breasts. Her husband currently has the flu. Reviewed Tamiflu is safe while lactating.   Observations/Objective: Pt tired appearing, speaking in coherent-clear sentences   EPDS 2 Last pap 2022 NILM  Assessment and Plan:   Follow Up Instructions: -RTC in 2-4 wks for PP visit to arrange BTL-signed consent 2/9 -Ok to take tamiflu  -No concern for PPD, reminded pt that she has refills on her prescription for Welbutrin.   I discussed the assessment and treatment plan with the patient. The patient was provided an opportunity to ask questions and all were answered. The patient agreed with the plan and demonstrated an understanding of the  instructions.   The patient was advised to call back or seek an in-person evaluation if the symptoms worsen or if the condition fails to improve as anticipated.  I provided 15 minutes of non-face-to-face time during this encounter.   Martha [name], CNM

## 2022-08-17 ENCOUNTER — Ambulatory Visit: Payer: Medicaid Other | Admitting: Obstetrics and Gynecology

## 2022-08-17 DIAGNOSIS — Z3009 Encounter for other general counseling and advice on contraception: Secondary | ICD-10-CM

## 2022-09-05 ENCOUNTER — Encounter: Payer: Self-pay | Admitting: Obstetrics and Gynecology

## 2022-09-05 ENCOUNTER — Ambulatory Visit (INDEPENDENT_AMBULATORY_CARE_PROVIDER_SITE_OTHER): Payer: Medicaid Other | Admitting: Obstetrics and Gynecology

## 2022-09-05 VITALS — BP 109/76 | HR 66 | Ht 64.0 in | Wt 191.5 lb

## 2022-09-05 DIAGNOSIS — Z3009 Encounter for other general counseling and advice on contraception: Secondary | ICD-10-CM

## 2022-09-05 NOTE — Progress Notes (Signed)
Patient presents today to discuss BTL. Consent signed 07/14/22.
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
## 2022-09-08 ENCOUNTER — Other Ambulatory Visit: Payer: Self-pay

## 2022-09-08 ENCOUNTER — Encounter
Admission: RE | Admit: 2022-09-08 | Discharge: 2022-09-08 | Disposition: A | Discharge status: Home / Self Care | Payer: Medicaid Other | Source: Ambulatory Visit | Attending: Obstetrics and Gynecology | Admitting: Obstetrics and Gynecology

## 2022-09-08 HISTORY — DX: Gastro-esophageal reflux disease without esophagitis: K21.9

## 2022-09-08 NOTE — Patient Instructions (Addendum)
Your procedure is scheduled on: 4/ 15/ 2024  Report to the Registration Desk on the 1st floor of the [facility]. To find out your arrival time, please call [phone] between 1PM - 3PM on: 09/15/2022  If your arrival time is 6:00 am, do not arrive before that time as the [facility] entrance doors do not open until 6:00 am.  REMEMBER: Instructions that are not followed completely may result in serious medical risk, up to and including death; or upon the discretion of your surgeon and anesthesiologist your surgery may need to be rescheduled.  Do not eat food after midnight the night before surgery.  No gum chewing or hard candies.     One week prior to surgery: Stop Anti-inflammatories (NSAIDS) such as Advil, Aleve, Ibuprofen, Motrin, Naproxen, Naprosyn and Aspirin based products such as Excedrin, Goody's Powder, BC Powder. Stop ANY OVER THE COUNTER supplements until after surgery. You may however, continue to take Tylenol if needed for pain up until the day of surgery.  Continue taking all prescribed medications with the exception of the following:     TAKE ONLY THESE MEDICATIONS THE MORNING OF SURGERY WITH A SIP OF WATER:  buPROPion (WELLBUTRIN XL)     No Alcohol for 24 hours before or after surgery.  No Smoking including e-cigarettes for 24 hours before surgery.  No chewable tobacco products for at least 6 hours before surgery.  No nicotine patches on the day of surgery.  Do not use any "recreational" drugs for at least a week (preferably 2 weeks) before your surgery.  Please be advised that the combination of cocaine and anesthesia may have negative outcomes, up to and including death. If you test positive for cocaine, your surgery will be cancelled.  On the morning of surgery brush your teeth with toothpaste and water, you may rinse your mouth with mouthwash if you wish. Do not swallow any toothpaste or mouthwash.  Use CHG Soap as directed on instruction  sheet.  Do not wear jewelry, make-up, hairpins, clips or nail polish.  Do not wear lotions, powders, or perfumes.   Do not shave body hair from the neck down 48 hours before surgery.  Contact lenses, hearing aids and dentures may not be worn into surgery.  Do not bring valuables to the hospital. [facility] is not responsible for any missing/lost belongings or valuables.    Notify your doctor if there is any change in your medical condition (cold, fever, infection).  Wear comfortable clothing (specific to your surgery type) to the hospital.  After surgery, you can help prevent lung complications by doing breathing exercises.  Take deep breaths and cough every 1-2 hours. Your doctor may order a device called an Incentive Spirometer to help you take deep breaths.  If you are being admitted to the hospital overnight, leave your suitcase in the car. After surgery it may be brought to your room.  In case of increased patient census, it may be necessary for you, the patient, to continue your postoperative care in the Same Day Surgery department.  If you are being discharged the day of surgery, you will not be allowed to drive home. You will need a responsible individual to drive you home and stay with you for 24 hours after surgery.    Please call the Pre-admissions Testing Dept. at [phone] if you have any questions about these instructions.  Surgery Visitation Policy:  Patients having surgery or a procedure may have two visitors.  Children under  the age of 27 must have an adult with them who is not the patient.  Inpatient Visitation:    Visiting hours are 7 a.m. to 8 p.m. Up to four visitors are allowed at one time in a patient room. The visitors may rotate out with other people during the day.  One visitor age 27 or older may stay with the patient overnight and must be in the room by 8 p.m.       Preparing for Surgery with CHLORHEXIDINE GLUCONATE (CHG)  Soap  Chlorhexidine Gluconate (CHG) Soap  o An antiseptic cleaner that kills germs and bonds with the skin to continue killing germs even after washing  o Used for showering the night before surgery and morning of surgery  Before surgery, you can play an important role by reducing the number of germs on your skin.  CHG (Chlorhexidine gluconate) soap is an antiseptic cleanser which kills germs and bonds with the skin to continue killing germs even after washing.  Please do not use if you have an allergy to CHG or antibacterial soaps. If your skin becomes reddened/irritated stop using the CHG.  1. Shower the NIGHT BEFORE SURGERY and the MORNING OF SURGERY with CHG soap.  2. If you choose to wash your hair, wash your hair first as usual with your normal shampoo.  3. After shampooing, rinse your hair and body thoroughly to remove the shampoo.  4. Use CHG as you would any other liquid soap. You can apply CHG directly to the skin and wash gently with a scrungie or a clean washcloth.  5. Apply the CHG soap to your body only from the neck down. Do not use on open wounds or open sores. Avoid contact with your eyes, ears, mouth, and genitals (private parts). Wash face and genitals (private parts) with your normal soap.  6. Wash thoroughly, paying special attention to the area where your surgery will be performed.  7. Thoroughly rinse your body with warm water.  8. Do not shower/wash with your normal soap after using and rinsing off the CHG soap.  9. Pat yourself dry with a clean towel.  10. Wear clean pajamas to bed the night before surgery.  12. Place clean sheets on your bed the night of your first shower and do not sleep with pets.  13. Shower again with the CHG soap on the day of surgery prior to arriving at the hospital.  14. Do not apply any deodorants/lotions/powders.  15. Please wear clean clothes to the hospital.

## 2022-09-11 ENCOUNTER — Inpatient Hospital Stay: Admission: RE | Admit: 2022-09-11 | Payer: Medicaid Other | Source: Ambulatory Visit

## 2022-09-12 ENCOUNTER — Encounter
Admission: RE | Admit: 2022-09-12 | Discharge: 2022-09-12 | Disposition: A | Discharge status: Home / Self Care | Payer: Medicaid Other | Source: Ambulatory Visit | Attending: Obstetrics and Gynecology | Admitting: Obstetrics and Gynecology

## 2022-09-12 ENCOUNTER — Encounter: Payer: Self-pay | Admitting: Urgent Care

## 2022-09-12 DIAGNOSIS — Z3009 Encounter for other general counseling and advice on contraception: Secondary | ICD-10-CM | POA: Insufficient documentation

## 2022-09-12 DIAGNOSIS — Z01812 Encounter for preprocedural laboratory examination: Secondary | ICD-10-CM | POA: Diagnosis present

## 2022-09-12 LAB — TYPE AND SCREEN
ABO/RH(D): O POS
Antibody Screen: NEGATIVE
Extend sample reason: UNDETERMINED

## 2022-09-17 MED ORDER — ORAL CARE MOUTH RINSE: 15.0000 mL | Freq: Once | OROMUCOSAL | Status: DC

## 2022-09-17 MED ORDER — FAMOTIDINE 20 MG PO TABS: 20.0000 mg | ORAL_TABLET | Freq: Once | ORAL | Status: DC

## 2022-09-17 MED ORDER — LACTATED RINGERS IV SOLN: INTRAVENOUS | Status: DC

## 2022-09-17 MED ORDER — CHLORHEXIDINE GLUCONATE 0.12 % MT SOLN: 15.0000 mL | Freq: Once | OROMUCOSAL | Status: DC

## 2022-09-18 ENCOUNTER — Ambulatory Visit
Admission: RE | Admit: 2022-09-18 | Payer: Medicaid Other | Source: Home / Self Care | Admitting: Obstetrics and Gynecology

## 2022-09-18 ENCOUNTER — Encounter: Admission: RE | Payer: Self-pay | Source: Home / Self Care

## 2022-09-18 DIAGNOSIS — F4323 Adjustment disorder with mixed anxiety and depressed mood: Secondary | ICD-10-CM

## 2022-09-18 DIAGNOSIS — Z01818 Encounter for other preprocedural examination: Secondary | ICD-10-CM

## 2022-09-18 DIAGNOSIS — Z01812 Encounter for preprocedural laboratory examination: Secondary | ICD-10-CM

## 2022-09-18 SURGERY — LIGATION, FALLOPIAN TUBE, LAPAROSCOPIC
Anesthesia: Choice

## 2022-09-18 MED ORDER — POVIDONE-IODINE 10 % EX SWAB: 2.0000 | Freq: Once | CUTANEOUS | Status: DC

## 2022-09-18 MED ORDER — LACTATED RINGERS IV SOLN: INTRAVENOUS | Status: DC

## 2022-09-18 MED ORDER — PROPOFOL 10 MG/ML IV BOLUS
INTRAVENOUS | Status: AC
  Filled 2022-09-18: qty 20

## 2022-09-18 MED ORDER — FAMOTIDINE 20 MG PO TABS
ORAL_TABLET | ORAL | Status: AC
  Filled 2022-09-18: qty 1

## 2022-09-18 MED ORDER — FENTANYL CITRATE (PF) 100 MCG/2ML IJ SOLN
INTRAMUSCULAR | Status: AC
  Filled 2022-09-18: qty 2

## 2022-09-18 MED ORDER — CHLORHEXIDINE GLUCONATE 0.12 % MT SOLN
OROMUCOSAL | Status: AC
  Filled 2022-09-18: qty 15

## 2022-09-18 MED ORDER — MIDAZOLAM HCL 2 MG/2ML IJ SOLN
INTRAMUSCULAR | Status: AC
  Filled 2022-09-18: qty 2

## 2022-09-22 NOTE — Progress Notes (Deleted)
   OBSTETRICS POSTPARTUM CLINIC PROGRESS NOTE  Subjective:     Martha [name] is a 27 y.o. [phone] female who presents for a postpartum visit. She is {1-10:13787} {time; units:18646} postpartum following a spontaneous vaginal delivery. I have fully reviewed the prenatal and intrapartum course. The delivery was at 38 & 4 day gestational weeks.  Anesthesia: epidural. Postpartum course has been ***. Baby's course has been ***. Baby is feeding by {breast/bottle:69}. Bleeding: patient {HAS HAS JYN:82956} not resumed menses, with Patient's last menstrual period was 08/29/2022 (approximate).. Bowel function is {normal:32111}. Bladder function is {normal:32111}. Patient {is/is not:9024} sexually active. Contraception method desired is {contraceptive method:5051}. Postpartum depression screening: {neg default:13464::"negative"}.  EDPS score is ***.    The following portions of the patient's history were reviewed and updated as appropriate: allergies, current medications, past family history, past medical history, past social history, past surgical history, and problem list.  Review of Systems {ros; complete:30496}   Objective:    LMP 08/29/2022 (Approximate)   General:  alert and no distress   Breasts:  inspection negative, no nipple discharge or bleeding, no masses or nodularity palpable  Lungs: clear to auscultation bilaterally  Heart:  regular rate and rhythm, S1, S2 normal, no murmur, click, rub or gallop  Abdomen: soft, non-tender; bowel sounds normal; no masses,  no organomegaly.  ***Well healed Pfannenstiel incision   Vulva:  normal  Vagina: normal vagina, no discharge, exudate, lesion, or erythema  Cervix:  no cervical motion tenderness and no lesions  Corpus: normal size, contour, position, consistency, mobility, non-tender  Adnexa:  normal adnexa and no mass, fullness, tenderness  Rectal Exam: Not performed.         Labs:  Lab Results  Component Value Date   HGB 10.9 (L)  07/14/2022     Assessment:   No diagnosis found.   Plan:    1. Contraception: tubal ligation 2. Will check Hgb for h/o postpartum anemia of less than 10.  3. Follow up in: {1-10:13787} {time; units:19136} or as needed.    Paula Compton CNM [facility]

## 2022-09-28 ENCOUNTER — Ambulatory Visit: Payer: Medicaid Other | Admitting: Obstetrics

## 2022-10-03 ENCOUNTER — Encounter: Payer: Medicaid Other | Admitting: Obstetrics and Gynecology

## 2022-10-12 ENCOUNTER — Ambulatory Visit: Payer: Medicaid Other | Admitting: Obstetrics

## 2022-10-13 ENCOUNTER — Encounter: Payer: Self-pay | Admitting: Advanced Practice Midwife

## 2022-10-13 ENCOUNTER — Ambulatory Visit (INDEPENDENT_AMBULATORY_CARE_PROVIDER_SITE_OTHER): Payer: Medicaid Other | Admitting: Advanced Practice Midwife

## 2022-10-13 DIAGNOSIS — Z30430 Encounter for insertion of intrauterine contraceptive device: Secondary | ICD-10-CM | POA: Diagnosis not present

## 2022-10-13 DIAGNOSIS — Z3202 Encounter for pregnancy test, result negative: Secondary | ICD-10-CM

## 2022-10-13 DIAGNOSIS — F4323 Adjustment disorder with mixed anxiety and depressed mood: Secondary | ICD-10-CM

## 2022-10-13 LAB — POCT URINE PREGNANCY: Preg Test, Ur: NEGATIVE

## 2022-10-13 MED ORDER — PARAGARD INTRAUTERINE COPPER IU IUD
1.0000 | INTRAUTERINE_SYSTEM | Freq: Once | INTRAUTERINE | Status: AC
Start: 2022-10-13 — End: 2022-10-13
  Administered 2022-10-13: 1 via INTRAUTERINE

## 2022-10-13 MED ORDER — BUPROPION HCL ER (XL) 300 MG PO TB24
300.0000 mg | ORAL_TABLET | Freq: Every day | ORAL | 11 refills | Status: DC
Start: 2022-10-13 — End: 2022-11-09

## 2022-10-13 NOTE — Progress Notes (Signed)
[facility]  GYNECOLOGY OFFICE PROCEDURE NOTE  Martha [name] is a 27 y.o. [phone] here for Paragard IUD insertion. No GYN concerns.  Last pap smear was in 2022 and was normal.  The patient is currently using none for contraception and her LMP is Patient's last menstrual period was 08/28/2022. The indication for her IUD is contraception.  IUD Insertion Procedure Note Patient identified, informed consent performed, consent signed.   Discussed risks of irregular bleeding, cramping, infection, malpositioning, expulsion or uterine perforation of the IUD (1:1000 placements)  which may require further procedure such as laparoscopy.  IUD while effective at preventing pregnancy do not prevent transmission of sexually transmitted diseases and use of barrier methods for this purpose was discussed. Time out was performed.  Urine pregnancy test negative.  Speculum placed in the vagina.  Cervix visualized.  Cleaned with Betadine x 2.  Grasped anteriorly with a single tooth tenaculum.  Uterus sounded to 8 cm. IUD placed per manufacturer's recommendations.  Strings trimmed to 3 cm. Tenaculum was removed, good hemostasis noted.  Patient tolerated procedure well.   Patient was given post-procedure instructions.  She was advised to have backup contraception for one week.  Patient was also asked to check IUD strings periodically and follow up in 4-6 weeks for IUD check.  IUD insertion CPT 58300,  Skyla [id number] Mirena [id number] Liletta [id number] Paraguard [id number] Rutha Bouchard [id number] Modifer 25, plus Modifer 79 is done during a global billing visit    Tresea Mall, CNM [facility] 10/13/2022 3:58 PM

## 2022-10-13 NOTE — Progress Notes (Signed)
[facility]   Postpartum Visit  Chief Complaint:  Chief Complaint  Patient presents with   Postpartum Care    Wants to increase the Wellbutrin    History of Present Illness: Patient is a 27 y.o. [id number] presents for postpartum visit.  Review the Delivery Report for details.   Date of delivery: 07/13/2022 Raeford Razor CNM delivered) Type of delivery: Vaginal delivery - Vacuum or forceps assisted  no Episiotomy No.  Laceration: no  Pregnancy or labor problems:  no Any problems since the delivery:  no  Newborn Details:  SINGLETON :  1. BabyGender female. Birth weight: 3310 g Maternal Details:  Breast or formula feeding: breastfeeding and supplementing with formula  Intercourse: Yes,  sometimes without protection, reviewed remote possibility of pregnancy, negative test today, history of irregular periods, breastfeeding.  Contraception after delivery: desires to proceed with Paragard IUD today  Any bowel or bladder issues: No  Post partum depression/anxiety noted:  no (she requests increase in dose of Wellbutrin) Edinburgh Post-Partum Depression Score: 3 Date of last PAP: 2022  no abnormalities   Review of Systems: Review of Systems  Constitutional:  Negative for chills and fever.  HENT:  Negative for congestion, ear discharge, ear pain, hearing loss, sinus pain and sore throat.   Eyes:  Negative for blurred vision and double vision.  Respiratory:  Negative for cough, shortness of breath and wheezing.   Cardiovascular:  Negative for chest pain, palpitations and leg swelling.  Gastrointestinal:  Negative for abdominal pain, blood in stool, constipation, diarrhea, heartburn, melena, nausea and vomiting.  Genitourinary:  Negative for dysuria, flank pain, frequency, hematuria and urgency.  Musculoskeletal:  Negative for back pain, joint pain and myalgias.  Skin:  Negative for itching and rash.  Neurological:  Negative for dizziness, tingling, tremors, sensory change, speech  change, focal weakness, seizures, loss of consciousness, weakness and headaches.  Endo/Heme/Allergies:  Negative for environmental allergies. Does not bruise/bleed easily.  Psychiatric/Behavioral:  Negative for depression, hallucinations, memory loss, substance abuse and suicidal ideas. The patient is not nervous/anxious and does not have insomnia.     Past Medical History:  Past Medical History:  Diagnosis Date   Adjustment disorder with mixed anxiety and depressed mood    Bipolar 1 disorder (HCC)    Former smoker    stopped smoking 08/2017; did smoke 3/4 PPD   GERD (gastroesophageal reflux disease)    Hernia of abdominal wall    History of substance use    cocaine and MJ   Migraine with aura    Suprvsn of high risk preg due to social problems, unsp tri 02/01/2018   Clinic [facility] Prenatal Labs  Dating  LMP =12 wk [place] Blood type: --/--/O POS  (02/26 1504)   Genetic Screen 1 Screen:    AFP:     Quad:     NIPS: Antibody:Negative (05/07 0000)  Anatomic [place] Complete, normal Rubella:    MMR x2 Varicella: Varivax x2  GTT Early:               Third trimester: 89 RPR: Non Reactive (09/20 1106)   Rhogam Not needed HBsAg: Negative (05/07 0000)   TDaP vaccine    10/18/1    Past Surgical History:  Past Surgical History:  Procedure Laterality Date   NO PAST SURGERIES     Patient desires permanent sterilization      Family History:  Family History  Problem Relation Age of Onset   Endometriosis Mother    Mental retardation  Brother    Gestational diabetes Maternal Aunt    COPD Maternal Grandmother    Hypertension Maternal Grandmother    Diabetes Maternal Grandfather    Hypertension Maternal Grandfather    Heart attack Maternal Grandfather        stents placed   Kidney failure Maternal Grandfather        Stage 4   Autism Cousin        second cousin    Social History:  Social History   Socioeconomic History   Marital status: Significant Other    Spouse name: Jonny Ruiz is FOB   Number of  children: 1   Years of education: 11   Highest education level: Not on file  Occupational History   Occupation: homemaker    Comment: cleans on the side  Tobacco Use   Smoking status: Former    Packs/day: .25    Types: Cigarettes   Smokeless tobacco: Never  Vaping Use   Vaping Use: Every day   Devices: last vaping mid june 2023  Substance and Sexual Activity   Alcohol use: Not Currently    Comment: last etoh 3 weeks ago   Drug use: Not Currently    Types: Marijuana    Comment: not done in 2 1/2 yrs   Sexual activity: Yes    Partners: Male    Birth control/protection: None    Comment: hx IUD, ocp last used 2 yrs ago and no method since then  Other Topics Concern   Not on file  Social History Narrative   Lives with significant other /BF and  two kids.    Social Determinants of Health   Financial Resource Strain: Low Risk  (11/16/2021)   Overall Financial Resource Strain (CARDIA)    Difficulty of Paying Living Expenses: Not hard at all  Food Insecurity: No Food Insecurity (07/13/2022)   Hunger Vital Sign    Worried About Running Out of Food in the Last Year: Never true    Ran Out of Food in the Last Year: Never true  Transportation Needs: No Transportation Needs (07/13/2022)   PRAPARE - Administrator, Civil Service (Medical): No    Lack of Transportation (Non-Medical): No  Physical Activity: Inactive (11/16/2021)   Exercise Vital Sign    Days of Exercise per Week: 0 days    Minutes of Exercise per Session: 0 min  Stress: Stress Concern Present (11/16/2021)   [employer] of Occupational Health - Occupational Stress Questionnaire    Feeling of Stress : Very much  Social Connections: Moderately Isolated (11/16/2021)   Social Connection and Isolation Panel [NHANES]    Frequency of Communication with Friends and Family: More than three times a week    Frequency of Social Gatherings with Friends and Family: More than three times a week    Attends Religious  Services: Never    Database administrator or Organizations: No    Attends Banker Meetings: Never    Marital Status: Living with partner  Intimate Partner Violence: Not At Risk (07/13/2022)   Humiliation, Afraid, Rape, and Kick questionnaire    Fear of Current or Ex-Partner: No    Emotionally Abused: No    Physically Abused: No    Sexually Abused: No    Allergies:  No Known Allergies  Medications: Prior to Admission medications   Medication Sig Start Date End Date Taking? Authorizing Provider  buPROPion (WELLBUTRIN XL) 300 MG 24 hr tablet Take 1 tablet (300 mg total)  by mouth daily. 10/13/22  Yes Tresea Mall, CNM  paragard intrauterine copper IUD IUD 1 each by Intrauterine route once.   Yes [provider]    Physical Exam Blood pressure 110/68, pulse 74, height 5\' 3"  (1.6 m), weight 194 lb (88 kg), last menstrual period 08/28/2022, currently breastfeeding.    General: NAD HEENT: normocephalic, anicteric Pulmonary: No increased work of breathing Abdomen: NABS, soft, non-tender, non-distended.  Umbilicus without lesions.  No hepatomegaly, splenomegaly or masses palpable. No evidence of hernia. Genitourinary:  External: Normal external female genitalia.  Normal urethral meatus, normal Bartholin's and Skene's glands.    Vagina: Normal vaginal mucosa, no evidence of prolapse.    Cervix: Grossly normal in appearance, no bleeding  Uterus: Non-enlarged, mobile, normal contour.  No CMT  Adnexa: ovaries non-enlarged, no adnexal masses  Rectal: deferred Extremities: no edema, erythema, or tenderness Neurologic: Grossly intact Psychiatric: mood appropriate, affect full   Edinburgh Postnatal Depression Scale - 10/13/22 1028       Edinburgh Postnatal Depression Scale:  In the Past 7 Days   I have been able to laugh and see the funny side of things. 0    I have looked forward with enjoyment to things. 0    I have blamed myself unnecessarily when things went  wrong. 1    I have been anxious or worried for no good reason. 0    I have felt scared or panicky for no good reason. 0    Things have been getting on top of me. 1    I have been so unhappy that I have had difficulty sleeping. 0    I have felt sad or miserable. 1    I have been so unhappy that I have been crying. 0    The thought of harming myself has occurred to me. 0    Edinburgh Postnatal Depression Scale Total 3             Assessment: 27 y.o. [id number] presenting for 6 week postpartum visit  Plan: Problem List Items Addressed This Visit       Other   Adjustment disorder with mixed anxiety and depressed mood   Relevant Medications   buPROPion (WELLBUTRIN XL) 300 MG 24 hr tablet   Other Visit Diagnoses     6 weeks postpartum follow-up    -  Primary   Encounter for IUD insertion       Relevant Medications   paragard intrauterine copper IUD 1 each (Completed)   Other Relevant Orders   POCT urine pregnancy (Completed)        1) Contraception - Education given regarding options for contraception, as well as compatibility with breast feeding if applicable.  Patient plans on IUD for contraception. See note  2)  Pap - ASCCP guidelines and rationale discussed.  Patient opts for every 3 years screening interval  3) Patient underwent screening for postpartum depression with no signs of depression  4) Return in about 1 year (around 10/13/2023) for annual established gyn.   Tresea Mall, CNM [facility] 10/13/2022 3:56 PM

## 2022-10-25 ENCOUNTER — Telehealth: Payer: Self-pay | Admitting: Obstetrics and Gynecology

## 2022-10-25 NOTE — Telephone Encounter (Signed)
The patient has called back and is aware of cancelled reason for appointment.

## 2022-10-25 NOTE — Telephone Encounter (Signed)
-----   Message from Loman Chroman, CMA sent at 10/25/2022  1:30 PM EDT ----- Regarding: appt This patient did not have surgery and does not need a post-op on 5/28

## 2022-10-25 NOTE — Telephone Encounter (Addendum)
I contacted the patient via phone. I lfte message for the patient letting her know her appointment for 6/6 was no longer needed to follow up with Erskine Squibb on 6/6.

## 2022-10-31 ENCOUNTER — Encounter: Payer: Medicaid Other | Admitting: Obstetrics and Gynecology

## 2022-11-09 ENCOUNTER — Encounter: Payer: Self-pay | Admitting: Advanced Practice Midwife

## 2022-11-09 ENCOUNTER — Ambulatory Visit (INDEPENDENT_AMBULATORY_CARE_PROVIDER_SITE_OTHER): Payer: Medicaid Other | Admitting: Advanced Practice Midwife

## 2022-11-09 VITALS — BP 122/73 | HR 69 | Wt 177.1 lb

## 2022-11-09 DIAGNOSIS — Z30431 Encounter for routine checking of intrauterine contraceptive device: Secondary | ICD-10-CM

## 2022-11-09 DIAGNOSIS — F4323 Adjustment disorder with mixed anxiety and depressed mood: Secondary | ICD-10-CM

## 2022-11-09 MED ORDER — HYDROXYZINE HCL 25 MG PO TABS
25.0000 mg | ORAL_TABLET | Freq: Four times a day (QID) | ORAL | 2 refills | Status: DC | PRN
Start: 2022-11-09 — End: 2023-12-27

## 2022-11-09 MED ORDER — ESCITALOPRAM OXALATE 10 MG PO TABS
10.0000 mg | ORAL_TABLET | Freq: Every day | ORAL | 11 refills | Status: DC
Start: 2022-11-09 — End: 2023-12-27

## 2022-11-09 NOTE — Progress Notes (Signed)
       IUD String Check  Subjctive: [name] presents for IUD string check.  She had a Paragard placed 4 weeks ago.  Since placement of her IUD she had a period.  She denies cramping or discomfort.  She has had intercourse since placement.  She has not checked the strings.  She denies any fever, chills, nausea, vomiting, or other complaints.    She reports not as much improvement in mood as hoped on Wellbutrin 300 mg dose and she would like to switch to Lexapro. Her symptoms are mostly depression. Anxiety symptoms have lessened. Discussed Rx Atarax PRN.  Objective: BP 122/73   Pulse 69   Wt 177 lb 1.6 oz (80.3 kg)   LMP 08/28/2022   BMI 31.37 kg/m  Gen:  NAD, A&Ox3 HEENT: normocephalic, anicteric Pulmonary: no increased work of breathing Pelvic: Normal appearing external female genitalia, normal vaginal epithelium, no abnormal discharge. Normal appearing cervix.  IUD strings visible and 3 cm in length similar to at the time of placement. Psychiatric: mood appropriate, affect full Neurologic: grossly normal   Assessment: 27 y.o. year old female status post prior Paragard IUD placement 4 week ago, doing well.  Plan: 1.  The patient was given instructions to check her IUD strings monthly and call with any problems or concerns.  She should call for fevers, chills, abnormal vaginal discharge, pelvic pain, or other complaints. 2.  She will return for a annual exam in 1 year.  All questions answered. 3.  Depression/Anxiety: Discontinue Wellbutrin. Start Lexapro.Rx Atarax  4.  Follow up as needed   Martha [name], CNM [facility] 11/09/2022 11:52 AM

## 2023-01-15 ENCOUNTER — Other Ambulatory Visit: Payer: MEDICAID

## 2023-01-15 ENCOUNTER — Telehealth: Payer: Self-pay

## 2023-01-15 DIAGNOSIS — Z3200 Encounter for pregnancy test, result unknown: Secondary | ICD-10-CM

## 2023-01-15 NOTE — Telephone Encounter (Signed)
I contacted the patient via phone. She is scheduled for 2:40 pm for labs

## 2023-01-15 NOTE — Telephone Encounter (Signed)
Pt calling for a blood preg test as she has had some faint lines to pop up.  Is freaking out.  [phone]

## 2023-03-07 ENCOUNTER — Other Ambulatory Visit (HOSPITAL_COMMUNITY)
Admission: RE | Admit: 2023-03-07 | Discharge: 2023-03-07 | Disposition: A | Discharge status: Home / Self Care | Payer: MEDICAID | Source: Ambulatory Visit

## 2023-03-07 ENCOUNTER — Ambulatory Visit (INDEPENDENT_AMBULATORY_CARE_PROVIDER_SITE_OTHER): Payer: MEDICAID

## 2023-03-07 VITALS — BP 123/78 | HR 87 | Wt 200.9 lb

## 2023-03-07 DIAGNOSIS — N898 Other specified noninflammatory disorders of vagina: Secondary | ICD-10-CM

## 2023-03-07 NOTE — Progress Notes (Signed)
   GYN ENCOUNTER  Encounter for IUD string check  Subjective  HPI: Martha [name] is a 27 y.o. [id number] who presents today for IUD string check.   Paragard IUD placed in June of this year when she was about 10 weeks PP. Has had no issues. Checking strings regularly and felt that they were extra long when she checked this last week. Concerned for dislodged IUD.   Past Medical History:  Diagnosis Date   Adjustment disorder with mixed anxiety and depressed mood    Bipolar 1 disorder (HCC)    Former smoker    stopped smoking 08/2017; did smoke 3/4 PPD   GERD (gastroesophageal reflux disease)    Hernia of abdominal wall    History of substance use    cocaine and MJ   Migraine with aura    Suprvsn of high risk preg due to social problems, unsp tri 02/01/2018   Clinic [facility] Prenatal Labs  Dating  LMP =12 wk [place] Blood type: --/--/O POS  (02/26 1504)   Genetic Screen 1 Screen:    AFP:     Quad:     NIPS: Antibody:Negative (05/07 0000)  Anatomic [place] Complete, normal Rubella:    MMR x2 Varicella: Varivax x2  GTT Early:               Third trimester: 89 RPR: Non Reactive (09/20 1106)   Rhogam Not needed HBsAg: Negative (05/07 0000)   TDaP vaccine    10/18/1   Past Surgical History:  Procedure Laterality Date   NO PAST SURGERIES     Patient desires permanent sterilization     OB History     Gravida  3   Para  2   Term  2   Preterm  0   AB  1   Living  2      SAB  1   IAB  0   Ectopic  0   Multiple  0   Live Births  2          No Known Allergies  Review of Systems  12 point ROS negative except for pertinent positives noted in HPO above.   Objective  BP 123/78   Pulse 87   Wt 200 lb 14.4 oz (91.1 kg)   BMI 35.59 kg/m   Physical examination GENERAL APPEARANCE: alert, well appearing, in no apparent distress LUNGS: normal work of breathing HEART: normal heart rate PELVIC: Normal female external genitalia, vagina well-rugated, cervix normal in appearance,  no lesions, IUD strings visualized, approximately 5 cm in length.   Assessment/Plan - Reviewed normal findings, IUD not visible at cervical os. Discussed trimming strings. Patient desires to wait at this time and will return to clinic if strings become bothersome. - Vaginal discharge: swab collected today to rule out infection. Will follow up as needed.   Lindalou Hose Lucely Leard, CNM  03/07/23 11:43 AM

## 2023-03-12 ENCOUNTER — Other Ambulatory Visit: Payer: Self-pay

## 2023-03-12 DIAGNOSIS — B9689 Other specified bacterial agents as the cause of diseases classified elsewhere: Secondary | ICD-10-CM

## 2023-03-12 LAB — CERVICOVAGINAL ANCILLARY ONLY
Bacterial Vaginitis (gardnerella): POSITIVE — AB
Candida Glabrata: NEGATIVE
Candida Vaginitis: NEGATIVE
Chlamydia: NEGATIVE
Comment: NEGATIVE
Comment: NEGATIVE
Comment: NEGATIVE
Comment: NEGATIVE
Comment: NEGATIVE
Comment: NORMAL
Neisseria Gonorrhea: NEGATIVE
Trichomonas: NEGATIVE

## 2023-03-12 MED ORDER — METRONIDAZOLE 500 MG PO TABS
500.0000 mg | ORAL_TABLET | Freq: Two times a day (BID) | ORAL | 0 refills | Status: DC
Start: 2023-03-12 — End: 2023-04-05

## 2023-03-21 ENCOUNTER — Telehealth: Payer: Self-pay

## 2023-03-21 NOTE — Telephone Encounter (Signed)
Pt called triage asking if she should be seen or if advise can be given. She noticed since last night dark brown vag discharge. Denies vaginal odor and or itching, bright red blood/vaginal bleeding flow. Advised to keep an eye on this and if new vag sx develop to call office to schedule vag exam or nurse visit for self swab.

## 2023-04-05 ENCOUNTER — Ambulatory Visit (INDEPENDENT_AMBULATORY_CARE_PROVIDER_SITE_OTHER): Payer: MEDICAID

## 2023-04-05 VITALS — BP 121/83 | HR 83 | Wt 200.9 lb

## 2023-04-05 DIAGNOSIS — Z30431 Encounter for routine checking of intrauterine contraceptive device: Secondary | ICD-10-CM

## 2023-04-05 DIAGNOSIS — N939 Abnormal uterine and vaginal bleeding, unspecified: Secondary | ICD-10-CM

## 2023-04-05 DIAGNOSIS — N898 Other specified noninflammatory disorders of vagina: Secondary | ICD-10-CM

## 2023-04-05 MED ORDER — LO LOESTRIN FE 1 MG-10 MCG / 10 MCG PO TABS
1.0000 | ORAL_TABLET | Freq: Every day | ORAL | 2 refills | Status: DC
Start: 2023-04-05 — End: 2023-12-27

## 2023-04-05 NOTE — Progress Notes (Signed)
   GYN ENCOUNTER  Encounter for bleeding with Paragard IUD  Subjective  HPI: Martha [name] is a 27 y.o. [id number] who presents today for irregular bleeding with Paragard IUD placed in May. Beginning of this month, wiped when going to bathroom and had brown discharge then started period about a week later and has lasted for about two weeks. Has had this happen previously in the time period since IUD was placed. Also has lower back pain and cramping.   Past Medical History:  Diagnosis Date   Adjustment disorder with mixed anxiety and depressed mood    Bipolar 1 disorder (HCC)    Former smoker    stopped smoking 08/2017; did smoke 3/4 PPD   GERD (gastroesophageal reflux disease)    Hernia of abdominal wall    History of substance use    cocaine and MJ   Migraine with aura    Suprvsn of high risk preg due to social problems, unsp tri 02/01/2018   Clinic [facility] Prenatal Labs  Dating  LMP =12 wk [place] Blood type: --/--/O POS  (02/26 1504)   Genetic Screen 1 Screen:    AFP:     Quad:     NIPS: Antibody:Negative (05/07 0000)  Anatomic [place] Complete, normal Rubella:    MMR x2 Varicella: Varivax x2  GTT Early:               Third trimester: 89 RPR: Non Reactive (09/20 1106)   Rhogam Not needed HBsAg: Negative (05/07 0000)   TDaP vaccine    10/18/1   Past Surgical History:  Procedure Laterality Date   NO PAST SURGERIES     Patient desires permanent sterilization     OB History     Gravida  3   Para  2   Term  2   Preterm  0   AB  1   Living  2      SAB  1   IAB  0   Ectopic  0   Multiple  0   Live Births  2          No Known Allergies  Review of Systems  12 point ROS negative except for pertinent positives noted in HPO above.   Objective  BP 121/83   Pulse 83   Wt 200 lb 14.4 oz (91.1 kg)   Breastfeeding No   BMI 35.59 kg/m   Physical examination GENERAL APPEARANCE: alert, well appearing LUNGS: normal work of breathing HEART: normal heart  rate -Declines pelvic exam  Assessment/Plan - Reviewed normal changes in bleeding that can occur with Paragard IUD including heavier than normal periods. Discussed options for management including removal of IUD, replacement with LNG-IUD, trial of COCs for 3 months to reset uterine lining. After this discussion, she desires to try a low-estrogen COC for 3 months. Will follow up at that time if bleeding continues to be a concern.   Lindalou Hose Mylin Gignac, CNM  04/05/23 10:48 AM

## 2023-12-12 ENCOUNTER — Ambulatory Visit: Payer: MEDICAID

## 2023-12-13 ENCOUNTER — Ambulatory Visit: Payer: MEDICAID

## 2023-12-13 VITALS — BP 105/65 | HR 90 | Wt 205.0 lb

## 2023-12-13 DIAGNOSIS — N3001 Acute cystitis with hematuria: Secondary | ICD-10-CM | POA: Diagnosis not present

## 2023-12-13 LAB — POCT URINALYSIS DIPSTICK
Bilirubin, UA: NEGATIVE
Glucose, UA: NEGATIVE
Ketones, UA: NEGATIVE
Nitrite, UA: NEGATIVE
Protein, UA: POSITIVE — AB
Spec Grav, UA: 1.015 (ref 1.010–1.025)
Urobilinogen, UA: 0.2 U/dL
pH, UA: 6.5 (ref 5.0–8.0)

## 2023-12-13 MED ORDER — SULFAMETHOXAZOLE-TRIMETHOPRIM 800-160 MG PO TABS
1.0000 | ORAL_TABLET | Freq: Two times a day (BID) | ORAL | 0 refills | Status: DC
Start: 2023-12-13 — End: 2023-12-27

## 2023-12-13 NOTE — Patient Instructions (Signed)
 Sulfamethoxazole ; Trimethoprim  Tablets What is this medication? SULFAMETHOXAZOLE ; TRIMETHOPRIM  (suhl fuh meth OK suh zohl; trye METH oh prim) treats infections caused by bacteria. It belongs to a group of medications called sulfonamide antibiotics. It will not treat colds, the flu, or infections caused by viruses. This medicine may be used for other purposes; ask your health care provider or pharmacist if you have questions. COMMON BRAND NAME(S): Bacter-Aid DS, Bactrim , Bactrim  DS, Septra , Septra  DS What should I tell my care team before I take this medication? They need to know if you have any of these conditions: G6PD deficiency HIV or AIDS Kidney disease Liver disease Low platelet levels Low red blood cell levels Poor nutrition Stomach or intestine problems, such as colitis Thyroid disease An unusual or allergic reaction to sulfamethoxazole , trimethoprim , other medications, foods, dyes, or preservatives Pregnant or trying to get pregnant Breast-feeding How should I use this medication? Take this medication by mouth with a glass of water. Follow the directions on the prescription label. Take your medication at regular intervals. Do not take it more often than directed. Take all of your medication as directed even if you think you are better. Do not skip doses or stop your medication early. Talk to your care team about the use of this medication in children. While this medication may be prescribed for children as young as 2 months for selected conditions, precautions do apply. Overdosage: If you think you have taken too much of this medicine contact a poison control center or emergency room at once. NOTE: This medicine is only for you. Do not share this medicine with others. What if I miss a dose? If you miss a dose, take it as soon as you can. If it is almost time for your next dose, take only that dose. Do not take double or extra doses. What may interact with this medication? Do not  take this medication with any of the following: Dofetilide This medication may also interact with the following: Amantadine Certain medications for blood pressure or heart disease Certain medications for depression, such as amitriptyline Certain medications for diabetes, such as glipizide or glyburide Certain medications that treat or prevent blood clots, such as warfarin Cyclosporine Digoxin Diuretics Estrogen and progestin hormones Indomethacin Methotrexate Phenytoin Procainamide Pyrimethamine Zidovudine This list may not describe all possible interactions. Give your health care provider a list of all the medicines, herbs, non-prescription drugs, or dietary supplements you use. Also tell them if you smoke, drink alcohol, or use illegal drugs. Some items may interact with your medicine. What should I watch for while using this medication? Tell your care team if your symptoms do not start to get better or if they get worse. Do not treat diarrhea with over the counter products. Contact your care team if you have diarrhea that lasts more than 2 days or if it is severe and watery. This medication may cause serious skin reactions. They can happen weeks to months after starting the medication. Contact your care team right away if you notice fevers or flu-like symptoms with a rash. The rash may be red or purple and then turn into blisters or peeling of the skin. Or, you might notice a red rash with swelling of the face, lips or lymph nodes in your neck or under your arms. This medication can make you more sensitive to the sun. Keep out of the sun. If you cannot avoid being in the sun, wear protective clothing and sunscreen. Do not use sun lamps or tanning  beds/booths. Be careful brushing or flossing your teeth or using a toothpick because you may get an infection or bleed more easily. If you have any dental work done, tell your dentist you are receiving this medication. What side effects may I  notice from receiving this medication? Side effects that you should report to your care team as soon as possible: Allergic reactions--skin rash, itching, hives, swelling of the face, lips, tongue, or throat Aplastic anemia--unusual weakness or fatigue, dizziness, headache, trouble breathing, increased bleeding or bruising, fever, chills, cough, or sore throat Dry cough, shortness of breath or trouble breathing High potassium level--muscle weakness, fast or irregular heartbeat Liver injury-- right upper belly pain, loss of appetite, nausea, light-colored stool, dark yellow or brown urine, yellowing skin or eyes, unusual weakness or fatigue Low blood sugar (hypoglycemia)--tremors or shaking, anxiety, sweating, cold or clammy skin, confusion, dizziness, rapid heartbeat Low sodium level--muscle weakness, fatigue, dizziness, headache, confusion Low thyroid levels (hypothyroidism)--unusual weakness or fatigue, increased sensitivity to cold, constipation, hair loss, dry skin, weight gain, feelings of depression Rash, fever, and swollen lymph nodes Redness, blistering, peeling, or loosening of the skin, including inside the mouth Severe diarrhea, fever Small, pus-filled bumps on skin Unusual vaginal discharge, itching, or odor Side effects that usually do not require medical attention (report to your care team if they continue or are bothersome): Loss of appetite Nausea Vomiting This list may not describe all possible side effects. Call your doctor for medical advice about side effects. You may report side effects to FDA at 1-800-FDA-1088. Where should I keep my medication? Keep out of the reach of children. Store between 15 and 25 degrees C (59 to 77 degrees F). Protect from light. Keep the container tightly closed. Throw away any unused medication after the expiration date. NOTE: This sheet is a summary. It may not cover all possible information. If you have questions about this medicine, talk to  your doctor, pharmacist, or health care provider.  2024 Elsevier/Gold Standard (2021-09-13 00:00:00)

## 2023-12-13 NOTE — Progress Notes (Signed)
    NURSE VISIT NOTE  Subjective:    Patient ID: Martha [name], female    DOB: 1996-02-23, 28 y.o.   MRN: [id number]       HPI  Patient is a 28 y.o. G53P2012 female who presents for dysuria, urinary frequency, urinary urgency, pelvic pain, and cloudy malordorous urine and hematuria for 4 days.  Patient denies flank pain, abdominal pain, genital rash, genital irritation, and vaginal discharge.  Patient does not have a history of recurrent UTI.  Patient does not have a history of pyelonephritis.    Objective:    BP 105/65   Pulse 90   Wt 205 lb (93 kg)   LMP 11/20/2023   Breastfeeding No   BMI 36.31 kg/m    Lab Review  Results for orders placed or performed in visit on 12/13/23  POCT urinalysis dipstick  Result Value Ref Range   Color, UA dark yellow3    Clarity, UA cloudy    Glucose, UA Negative Negative   Bilirubin, UA negative    Ketones, UA negative    Spec Grav, UA 1.015 1.010 - 1.025   Blood, UA moderate (A)    pH, UA 6.5 5.0 - 8.0   Protein, UA Positive (A) Negative   Urobilinogen, UA 0.2 0.2 or 1.0 E.U./dL   Nitrite, UA negative    Leukocytes, UA Large (3+) (A) Negative   Appearance     Odor      Assessment:   1. Acute cystitis with hematuria      Plan:   Urine Culture Sent. Treatment  Bactrim  DS 1 PO BID for 7 days. Maintain adequate hydration.  Follow up if symptoms worsen or fail to improve as anticipated, and as needed.    Rollo JINNY Maxin, CMA

## 2023-12-17 LAB — URINE CULTURE

## 2023-12-27 ENCOUNTER — Encounter: Payer: Self-pay | Admitting: Advanced Practice Midwife

## 2023-12-27 ENCOUNTER — Ambulatory Visit (INDEPENDENT_AMBULATORY_CARE_PROVIDER_SITE_OTHER): Payer: MEDICAID | Admitting: Advanced Practice Midwife

## 2023-12-27 ENCOUNTER — Other Ambulatory Visit (HOSPITAL_COMMUNITY)
Admission: RE | Admit: 2023-12-27 | Discharge: 2023-12-27 | Disposition: A | Discharge status: Home / Self Care | Payer: MEDICAID | Source: Ambulatory Visit | Attending: Advanced Practice Midwife | Admitting: Advanced Practice Midwife

## 2023-12-27 VITALS — BP 106/56 | HR 72 | Ht 63.0 in | Wt 206.2 lb

## 2023-12-27 DIAGNOSIS — Z124 Encounter for screening for malignant neoplasm of cervix: Secondary | ICD-10-CM | POA: Diagnosis present

## 2023-12-27 DIAGNOSIS — R638 Other symptoms and signs concerning food and fluid intake: Secondary | ICD-10-CM

## 2023-12-27 DIAGNOSIS — Z30431 Encounter for routine checking of intrauterine contraceptive device: Secondary | ICD-10-CM

## 2023-12-27 NOTE — Progress Notes (Signed)
   [facility]  IUD String Check/PAP smear  Subjctive: Ms. Arieal Cuoco presents for IUD string check.  She had a Paragard  placed 1 year  ago.  Since placement of her IUD she has monthly periods of heavy flow lasting 4-7 days. Usually has a day of spotting brown between periods. Says her partner can feel something sharp like the end of the IUD.   She denies cramping or discomfort.   She has checked the strings.  She denies any fever, chills, nausea, vomiting, or other complaints.    She mentions difficulty losing weight despite being active with her children and improving her diet. Suggested she see [facility] for recommendations regarding diet and exercise.    Review of Systems  Constitutional:  Negative for chills and fever.  HENT:  Negative for congestion, ear discharge, ear pain, hearing loss, sinus pain and sore throat.   Eyes:  Negative for blurred vision and double vision.  Respiratory:  Negative for cough, shortness of breath and wheezing.   Cardiovascular:  Negative for chest pain, palpitations and leg swelling.  Gastrointestinal:  Negative for abdominal pain, blood in stool, constipation, diarrhea, heartburn, melena, nausea and vomiting.  Genitourinary:  Negative for dysuria, flank pain, frequency, hematuria and urgency.  Musculoskeletal:  Negative for back pain, joint pain and myalgias.  Skin:  Negative for itching and rash.  Neurological:  Negative for dizziness, tingling, tremors, sensory change, speech change, focal weakness, seizures, loss of consciousness, weakness and headaches.  Endo/Heme/Allergies:  Negative for environmental allergies. Does not bruise/bleed easily.       Positive for breakthrough bleeding  Psychiatric/Behavioral:  Negative for depression, hallucinations, memory loss, substance abuse and suicidal ideas. The patient is not nervous/anxious and does not have insomnia.     Objective: BP (!) 106/56   Pulse 72   Ht 5' 3 (1.6 m)   Wt 206 lb 3.2  oz (93.5 kg)   LMP 12/27/2023   BMI 36.53 kg/m  Gen:  NAD, A&Ox3 HEENT: normocephalic, anicteric Pulmonary: no increased work of breathing Pelvic: Normal appearing external female genitalia, normal vaginal epithelium, no abnormal discharge. Normal appearing cervix.  IUD strings visible and 3 cm in length similar to at the time of placement. Strings are relaxed around cervix. PAP smear collected. Unable to feel any object in cervical canal using brush. Psychiatric: mood appropriate, affect full Neurologic: grossly normal   Assessment: 28 y.o. year old female with normal IUD check, cervical cancer screening  Plan: Return for annual exam in 1 year Follow up after PAP results Referral to [facility] sent   Slater Rains, CNM [facility] 12/27/2023 5:00 PM

## 2023-12-27 NOTE — Patient Instructions (Signed)
 Mediterranean Diet A Mediterranean diet is based on the traditions of countries on the [employer]. It focuses on eating more: Fruits and vegetables. Whole grains, beans, nuts, and seeds. Heart-healthy fats. These are fats that are good for your heart. It involves eating less: Dairy. Meat and eggs. Processed foods with added sugar, salt, and fat. This type of diet can help prevent certain conditions. It can also improve outcomes if you have a long-term (chronic) disease, such as kidney or heart disease. What are tips for following this plan? Reading food labels Check packaged foods for: The serving size. For foods such as rice and pasta, the serving size is the amount of cooked product, not dry. The total fat. Avoid foods with saturated fat or trans fat. Added sugars, such as corn syrup. Shopping  Try to have a balanced diet. Buy a variety of foods, such as: Fresh fruits and vegetables. You may be able to get these from local farmers markets. You can also buy them frozen. Grains, beans, nuts, and seeds. Some of these can be bought in bulk. Fresh seafood. Poultry and eggs. Low-fat dairy products. Buy whole ingredients instead of foods that have already been packaged. If you can't get fresh seafood, buy precooked frozen shrimp or canned fish, such as tuna, salmon, or sardines. Stock your pantry so you always have certain foods on hand, such as olive oil, canned tuna, canned tomatoes, rice, pasta, and beans. Cooking Cook foods with extra-virgin olive oil instead of using butter or other vegetable oils. Have meat as a side dish. Have vegetables or grains as your main dish. This means having meat in small portions or adding small amounts of meat to foods like pasta or stew. Use beans or vegetables instead of meat in common dishes like chili or lasagna. Try out different cooking methods. Try roasting, broiling, steaming, and sauting vegetables. Add frozen vegetables to soups, stews,  pasta, or rice. Add nuts or seeds for added healthy fats and plant protein at each meal. You can add these to yogurt, salads, or vegetable dishes. Marinate fish or vegetables using olive oil, lemon juice, garlic, and fresh herbs. Meal planning Plan to eat a vegetarian meal one day each week. Try to work up to two vegetarian meals, if possible. Eat seafood two or more times a week. Have healthy snacks on hand. These may include: Vegetable sticks with hummus. Greek yogurt. Fruit and nut trail mix. Eat balanced meals. These should include: Fruit: 2-3 servings a day. Vegetables: 4-5 servings a day. Low-fat dairy: 2 servings a day. Fish, poultry, or lean meat: 1 serving a day. Beans and legumes: 2 or more servings a week. Nuts and seeds: 1-2 servings a day. Whole grains: 6-8 servings a day. Extra-virgin olive oil: 3-4 servings a day. Limit red meat and sweets to just a few servings a month. Lifestyle  Try to cook and eat meals with your family. Drink enough fluid to keep your pee (urine) pale yellow. Be active every day. This includes: Aerobic exercise, which is exercise that causes your heart to beat faster. Examples include running and swimming. Leisure activities like gardening, walking, or housework. Get 7-8 hours of sleep each night. Drink red wine if your provider says you can. A glass of wine is 5 oz (150 mL). You may be allowed to have: Up to 1 glass a day if you're female and not pregnant. Up to 2 glasses a day if you're female. What foods should I eat? Fruits Apples. Apricots. Avocado.  Berries. Bananas. Cherries. Dates. Figs. Grapes. Lemons. Melon. Oranges. Peaches. Plums. Pomegranate. Vegetables Artichokes. Beets. Broccoli. Cabbage. Carrots. Eggplant. Green beans. Chard. Kale. Spinach. Onions. Leeks. Peas. Squash. Tomatoes. Peppers. Radishes. Grains Whole-grain pasta. Brown rice. Bulgur wheat. Polenta. Couscous. Whole-wheat bread. Orpah Cobb. Meats and other  proteins Beans. Almonds. Sunflower seeds. Pine nuts. Peanuts. Cod. Salmon. Scallops. Shrimp. Tuna. Tilapia. Clams. Oysters. Eggs. Chicken or [place] without skin. Dairy Low-fat milk. Cheese. Greek yogurt. Fats and oils Extra-virgin olive oil. Avocado oil. Grapeseed oil. Beverages Water. Red wine. Herbal tea. Sweets and desserts Greek yogurt with honey. Baked apples. Poached pears. Trail mix. Seasonings and condiments Basil. Cilantro. Coriander. Cumin. Mint. Parsley. Sage. Rosemary. Tarragon. Garlic. Oregano. Thyme. Pepper. Balsamic vinegar. Tahini. Hummus. Tomato sauce. Olives. Mushrooms. The items listed above may not be all the foods and drinks you can have. Talk to a dietitian to learn more. What foods should I limit? This is a list of foods that should be eaten rarely. Fruits Fruit canned in syrup. Vegetables Deep-fried potatoes, like [place] fries. Grains Packaged pasta or rice dishes. Cereal with added sugar. Snacks with added sugar. Meats and other proteins Beef. Pork. Lamb. Chicken or [place] with skin. Hot dogs. Tomasa Blase. Dairy Ice cream. Sour cream. Whole milk. Fats and oils Butter. Canola oil. Vegetable oil. Beef fat (tallow). Lard. Beverages Juice. Sugar-sweetened soft drinks. Beer. Liquor and spirits. Sweets and desserts Cookies. Cakes. Pies. Candy. Seasonings and condiments Mayonnaise. Pre-made sauces and marinades. The items listed above may not be all the foods and drinks you should limit. Talk to a dietitian to learn more. Where to find more information American Heart Association (AHA): heart.org This information is not intended to replace advice given to you by your health care provider. Make sure you discuss any questions you have with your health care provider. Document Revised: 09/03/2022 Document Reviewed: 09/03/2022 Elsevier Patient Education  2024 [employer].

## 2024-01-02 LAB — CYTOLOGY - PAP
Comment: NEGATIVE
Diagnosis: NEGATIVE
High risk HPV: NEGATIVE

## 2024-01-04 NOTE — Progress Notes (Signed)
 Medical Nutrition Therapy  Appointment Start time:  [phone]  Appointment End time:  1040  Primary concerns today: desire to reduce body weight to a lower BMI range  Referral diagnosis: Unable to lose weight  Preferred learning style: no preference indicated Learning readiness:  contemplating-ready   NUTRITION ASSESSMENT    Clinical Medical Hx:  Past Medical History:  Diagnosis Date   Adjustment disorder with mixed anxiety and depressed mood    Bipolar 1 disorder (HCC)    Former smoker    stopped smoking 08/2017; did smoke 3/4 PPD   GERD (gastroesophageal reflux disease)    Hernia of abdominal wall    History of substance use    cocaine and MJ   Migraine with aura    Suprvsn of high risk preg due to social problems, unsp tri 02/01/2018   Clinic [facility] Prenatal Labs  Dating  LMP =12 wk US  Blood type: --/--/O POS  (02/26 1504)   Genetic Screen 1 Screen:    AFP:     Quad:     NIPS: Antibody:Negative (05/07 0000)  Anatomic US  Complete, normal Rubella:    MMR x2 Varicella: Varivax x2  GTT Early:               Third trimester: 89 RPR: Non Reactive (09/20 1106)   Rhogam Not needed HBsAg: Negative (05/07 0000)   TDaP vaccine    10/18/1    Medications:  Current Outpatient Medications:    paragard  intrauterine copper  IUD IUD, 1 each by Intrauterine route once., Disp: , Rfl:   Labs:  Lab Results  Component Value Date   HGBA1C 5.1 12/30/2021   No results found for: CHOL, HDL, LDLCALC, LDLDIRECT, TRIG, CHOLHDL  Notable Signs/Symptoms:  Wt Readings from Last 3 Encounters:  12/27/23 206 lb 3.2 oz (93.5 kg)  12/13/23 205 lb (93 kg)  04/05/23 200 lb 14.4 oz (91.1 kg)   Lifestyle & Dietary Hx Pt present today with her two children. Pt reports she desires to reduce her body weight to a lower BMI range. Pt reports she does shopping and cooking. 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 Pt reports her appetite is low and states typical intake of 1 meal day. Pt reports eating out 2-3  times monthly. Pt reports she recently joined a coed soft ball team to increase physical activity participation. Pt reports she receives help fresh delivered meals. All Pt's questions questions were answered during this encounter.    Estimated daily fluid intake: 64 or more oz Supplements: none Sleep: on average 5-9 hours; fair Stress / self-care: 6 out of 10; time alone, EtOH, coloring, softball Current average weekly physical activity: softball 1 hour or more 2-4 days weekly; park with kids weekly   24-Hr Dietary Recall First Meal: skips, coffee with sugar and flavored creamer, water Snack: skips  Second Meal: reign energy drink, bit of noodles Snack: none Third Meal: 6-9 pm: shake and bake chicken, asparagus, grilled corn, 32 ounces tea made with sugar or salmon patties, broccoli and cheese or pizza  Snack: none Beverages: coffee with sugar and flavored creamer, water, 2 gallons tea made with 2 cups sugar   NUTRITION DIAGNOSIS  NB-1.1 Food and nutrition-related knowledge deficit As related to no prior nutrition related education .  As evidenced by Pt reports and dietary recall.  NUTRITION INTERVENTION  Nutrition education (E-1) on the following topics:  Fruits & Vegetables: Aim to fill half your plate with a variety of fruits and vegetables. They  are rich in vitamins, minerals, and fiber, and can help reduce the risk of chronic diseases. Choose a colorful assortment of fruits and vegetables to ensure you get a wide range of nutrients. Grains and Starches: Make at least half of your grain choices whole grains, such as brown rice, whole wheat bread, and oats. Whole grains provide fiber, which aids in digestion and healthy cholesterol levels. Aim for whole forms of starchy vegetables such as potatoes, sweet potatoes, beans, peas, and corn, which are fiber rich and provide many vitamins and minerals.  Protein: Incorporate lean sources of protein, such as poultry, fish, beans, nuts, and  seeds, into your meals. Protein is essential for building and repairing tissues, staying full, balancing blood sugar, as well as supporting immune function. Dairy: Include low-fat or fat-free dairy products like milk, yogurt, and cheese in your diet. Dairy foods are excellent sources of calcium and vitamin D , which are crucial for bone health.  Physical Activity: Aim for 60 minutes of physical activity daily. Regular physical activity promotes overall health-including helping to reduce risk for heart disease and diabetes, promoting mental health, and helping us  sleep better.    Handouts Provided Include  Plate Planner-[employer] Move Your Way-[facility]  Learning Style & Readiness for Change Teaching method utilized: Visual & Auditory  Demonstrated degree of understanding via: Teach Back  Barriers to learning/adherence to lifestyle change: unknown  Goals Established by Pt Avoid skipping meals aim for balanced meal using the plate planner or snacks in replace of a skipped meals Continue with softball! Great job!  MONITORING & EVALUATION Dietary intake, weekly physical activity  Next Steps  Patient is to return PRN.

## 2024-01-05 ENCOUNTER — Ambulatory Visit: Payer: Self-pay | Admitting: Advanced Practice Midwife

## 2024-01-07 ENCOUNTER — Encounter: Payer: MEDICAID | Attending: Advanced Practice Midwife | Admitting: Dietician

## 2024-01-07 DIAGNOSIS — R638 Other symptoms and signs concerning food and fluid intake: Secondary | ICD-10-CM | POA: Diagnosis present

## 2024-01-07 DIAGNOSIS — Z713 Dietary counseling and surveillance: Secondary | ICD-10-CM | POA: Diagnosis not present

## 2024-01-07 NOTE — Patient Instructions (Addendum)
 Avoid skipping meals aim for balanced meal using the plate palnner or snacks in replace of a skipped meals  Continue with softball! Great job!

## 2024-03-05 NOTE — Progress Notes (Signed)
 Medical Nutrition Therapy  Appointment Start time:  1020  Appointment End time:  1047  Primary concerns today: desire to reduce body weight to a lower BMI range  Referral diagnosis: Unable to lose weight  Preferred learning style: no preference indicated Learning readiness:  contemplating-ready   NUTRITION ASSESSMENT    Clinical Medical Hx:  Past Medical History:  Diagnosis Date   Adjustment disorder with mixed anxiety and depressed mood    Bipolar 1 disorder (HCC)    Former smoker    stopped smoking 08/2017; did smoke 3/4 PPD   GERD (gastroesophageal reflux disease)    Hernia of abdominal wall    History of substance use    cocaine and MJ   Migraine with aura    Suprvsn of high risk preg due to social problems, unsp tri 02/01/2018   Clinic [facility]renatal Labs  Dating  LMP =12 wk US  Blood type: --/--/O POS  (02/26 1504)   Genetic Screen 1 Screen:    AFP:     Quad:     NIPS: Antibody:Negative (05/07 0000)  Anatomic US  Complete, normal Rubella:    MMR x2 Varicella: Varivax x2  GTT Early:               Third trimester: 89 RPR: Non Reactive (09/20 1106)   Rhogam Not needed HBsAg: Negative (05/07 0000)   TDaP vaccine    10/18/1    Medications:  Current Outpatient Medications:    paragard  intrauterine copper  IUD IUD, 1 each by Intrauterine route once., Disp: , Rfl:   Labs:  Lab Results  Component Value Date   HGBA1C 5.1 12/30/2021   No results found for: CHOL, HDL, LDLCALC, LDLDIRECT, TRIG, CHOLHDL  Notable Signs/Symptoms:  Wt Readings from Last 3 Encounters:  03/10/24 199 lb 11.2 oz (90.6 kg)  12/27/23 206 lb 3.2 oz (93.5 kg)  12/13/23 205 lb (93 kg)   Lifestyle & Dietary Hx  Pt presents today for follow up with her daughter. Pt reports she desires to reduce her body weight to a lower BMI range. Pt reports she does shopping and cooking. 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   Pt reports she is having smaller portions and selecting one starch item at meals  times versus two or more. Pt reports she has decreased coffee and regular soda decreased intake. Pt reports she is improved her meals schedule having 2 meals with a snack options for lunch. Pt reports decreasing intake of sugary sweetened beverages. Pt reports she continue with soft ball team to increase physical activity participation. Pt reports she receives hello fresh delivered meals at times. Pt reports decreasing sweet tea intake to 3 times weekly. Pt reports sleep and stress improving with therapy. Pt reports she would like to increase physical activity using a treadmill and home weights. Pt reports eating out once times monthly. All Pt's questions questions were answered during this encounter.     Estimated daily fluid intake: 64 or more oz Supplements: none Sleep: on average 6-9 hours; fair Stress / self-care: 4 out of 10; time alone, EtOH, coloring, softball Current average weekly physical activity: softball 1 hour or more 2-4 days weekly; park with kids weekly, dancing while cleaning  24-Hr Dietary Recall First Meal: coffee with less sugar and flavored creamer, water, eggs, grits, avocado toast  Snack: skips  Second Meal: banana with trail mix or sandwich  Snack: none Third Meal: shake and bake chicken, asparagus, grilled corn or salmon patties, broccoli and  cheese or pizza chili bean vegetables soup or grilled chicken sandwich with lettuce tomato, onion Snack: none Beverages: coffee with less sugar and flavored creamer, water, sweet tea   NUTRITION DIAGNOSIS  NB-1.1 Food and nutrition-related knowledge deficit As related to no prior nutrition related education .  As evidenced by Pt reports and dietary recall.  NUTRITION INTERVENTION  Nutrition education (E-1) on the following topics:  Fruits & Vegetables: Aim to fill half your plate with a variety of fruits and vegetables. They are rich in vitamins, minerals, and fiber, and can help reduce the risk of chronic diseases. Choose  a colorful assortment of fruits and vegetables to ensure you get a wide range of nutrients. Grains and Starches: Make at least half of your grain choices whole grains, such as brown rice, whole wheat bread, and oats. Whole grains provide fiber, which aids in digestion and healthy cholesterol levels. Aim for whole forms of starchy vegetables such as potatoes, sweet potatoes, beans, peas, and corn, which are fiber rich and provide many vitamins and minerals.  Protein: Incorporate lean sources of protein, such as poultry, fish, beans, nuts, and seeds, into your meals. Protein is essential for building and repairing tissues, staying full, balancing blood sugar, as well as supporting immune function. Dairy: Include low-fat or fat-free dairy products like milk, yogurt, and cheese in your diet. Dairy foods are excellent sources of calcium and vitamin D , which are crucial for bone health.  Physical Activity: Aim for 60 minutes of physical activity daily. Regular physical activity promotes overall health-including helping to reduce risk for heart disease and diabetes, promoting mental health, and helping us  sleep better.     Learning Style & Readiness for Change Teaching method utilized: Visual & Auditory  Demonstrated degree of understanding via: Teach Back  Barriers to learning/adherence to lifestyle change: none  Goals Established by Pt Great job with you meal schedule and portions and beverages choices Physical activity of 150 minutes weekly or more and 2 days of muscle strengthening 2 days weekly as tolerated    MONITORING & EVALUATION Dietary intake, weekly physical activity  Next Steps  Patient is to return: 05/12/2024

## 2024-03-10 ENCOUNTER — Encounter: Payer: MEDICAID | Attending: Advanced Practice Midwife | Admitting: Dietician

## 2024-03-10 VITALS — Wt 199.7 lb

## 2024-03-10 DIAGNOSIS — R638 Other symptoms and signs concerning food and fluid intake: Secondary | ICD-10-CM | POA: Diagnosis present

## 2024-03-13 ENCOUNTER — Other Ambulatory Visit: Payer: Self-pay

## 2024-03-13 ENCOUNTER — Emergency Department
Admission: EM | Admit: 2024-03-13 | Discharge: 2024-03-13 | Disposition: A | Discharge status: Home / Self Care | Payer: MEDICAID | Attending: Emergency Medicine | Admitting: Emergency Medicine

## 2024-03-13 ENCOUNTER — Encounter: Payer: Self-pay | Admitting: Emergency Medicine

## 2024-03-13 DIAGNOSIS — L259 Unspecified contact dermatitis, unspecified cause: Secondary | ICD-10-CM | POA: Insufficient documentation

## 2024-03-13 MED ORDER — HYDROCORTISONE 0.5 % EX CREA: 1.0000 | TOPICAL_CREAM | Freq: Two times a day (BID) | CUTANEOUS | 0 refills | Status: DC

## 2024-03-13 MED ORDER — PREDNISONE 20 MG PO TABS: 40.0000 mg | ORAL_TABLET | Freq: Every day | ORAL | 0 refills | Status: AC

## 2024-03-13 NOTE — Discharge Instructions (Signed)
 You may take the steroids to help with your symptoms.  You may also use the ointment, but do not apply the ointment to your face or to your genital area.  Please return for any new, worsening, or changing symptoms or other concerns.  It was a pleasure caring for you today.
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
## 2024-03-13 NOTE — ED Triage Notes (Signed)
 Pt reports poison sumac all over body since Friday. No relief with OTC meds.

## 2024-04-03 ENCOUNTER — Encounter: Payer: Self-pay | Admitting: Certified Nurse Midwife

## 2024-04-03 ENCOUNTER — Telehealth: Payer: Self-pay

## 2024-04-03 ENCOUNTER — Ambulatory Visit: Payer: MEDICAID | Admitting: Certified Nurse Midwife

## 2024-04-03 VITALS — BP 134/91 | HR 81 | Resp 16 | Ht 63.0 in | Wt 201.3 lb

## 2024-04-03 DIAGNOSIS — Z30432 Encounter for removal of intrauterine contraceptive device: Secondary | ICD-10-CM | POA: Diagnosis not present

## 2024-04-03 MED ORDER — ETONOGESTREL-ETHINYL ESTRADIOL 0.12-0.015 MG/24HR VA RING: VAGINAL_RING | VAGINAL | 12 refills | Status: AC

## 2024-04-03 NOTE — Progress Notes (Signed)
    GYNECOLOGY PROGRESS NOTE  Subjective:    Patient ID: Martha [name], female    DOB: 07/31/1995, 28 y.o.   MRN: [id number]  HPI  Patient is a 28 y.o. [id number] female who presents for IUD check. She started cramping after intercourse on Sunday. She reports that her IUD feel different. She felt something pop when she sat down, she checked IUD and could only feel on string and then she felt something hard hanging out of her cervix. She has a history of IUD being in wrong location.  The following portions of the patient's history were reviewed and updated as appropriate: allergies, current medications, past family history, past medical history, past social history, past surgical history, and problem list.  Review of Systems Pertinent items are noted in HPI.   Objective:   Blood pressure (!) 134/91, pulse 81, resp. rate 16, height 5' 3 (1.6 m), weight 201 lb 4.8 oz (91.3 kg), last menstrual period 02/21/2024, not currently breastfeeding. Body mass index is 35.66 kg/m. General appearance: alert and cooperative Pelvic: IUD visible at cervical os.     Assessment:   1. Encounter for IUD removal      Plan:   IUD Removal  Patient was in the dorsal lithotomy position, normal external genitalia was noted.  A speculum was placed in the patient's vagina, normal discharge was noted, no lesions. The multiparous cervix was visualized, no lesions, no abnormal discharge. IUD visible in os.  The strings of the IUD were grasped and pulled using ring forceps. The IUD was removed in its entirety.    Patient tolerated the procedure well.    Patient will use nuvaring for contraception. Routine preventative health maintenance measures emphasized.  Martha [name], [place], [place] 04/03/2024 1:18 PM     Damien Parsley, CNM [facility]

## 2024-04-03 NOTE — Telephone Encounter (Signed)
 Chart reviewed. Patient is scheduled for appointment today 04/03/24.

## 2024-04-03 NOTE — Telephone Encounter (Signed)
 SABRA

## 2024-04-17 ENCOUNTER — Telehealth: Payer: Self-pay

## 2024-04-17 DIAGNOSIS — B9689 Other specified bacterial agents as the cause of diseases classified elsewhere: Secondary | ICD-10-CM

## 2024-04-17 MED ORDER — METRONIDAZOLE 500 MG PO TABS: 500.0000 mg | ORAL_TABLET | Freq: Two times a day (BID) | ORAL | 0 refills | Status: DC

## 2024-04-17 NOTE — Telephone Encounter (Signed)
 Patient calling triage with concerns of a possible BV. Has noticed increased greenish vaginal dischargea couple of times today. States last time she had BV she has same symptom. Denies vaginal itching, irritation and/or odor. States she has flu going around her house and doesn't really want to come in and get us  sick. Advised Flagyl  will be sent, if sx persist after treatment, she will need an appointment.

## 2024-05-08 NOTE — Progress Notes (Deleted)
 Medical Nutrition Therapy  Appointment Start time:  ***  Appointment End time:  ***  Primary concerns today: desire to reduce body weight to a lower BMI range  Referral diagnosis: Unable to lose weight  Preferred learning style: no preference indicated Learning readiness:  contemplating-ready   NUTRITION ASSESSMENT    Clinical Medical Hx:  Past Medical History:  Diagnosis Date   Adjustment disorder with mixed anxiety and depressed mood    Bipolar 1 disorder (HCC)    Former smoker    stopped smoking 08/2017; did smoke 3/4 PPD   GERD (gastroesophageal reflux disease)    Hernia of abdominal wall    History of substance use    cocaine and MJ   Migraine with aura    Suprvsn of high risk preg due to social problems, unsp tri 02/01/2018   Clinic [facility] Prenatal Labs  Dating  LMP =12 wk US  Blood type: --/--/O POS  (02/26 1504)   Genetic Screen 1 Screen:    AFP:     Quad:     NIPS: Antibody:Negative (05/07 0000)  Anatomic US  Complete, normal Rubella:    MMR x2 Varicella: Varivax x2  GTT Early:               Third trimester: 89 RPR: Non Reactive (09/20 1106)   Rhogam Not needed HBsAg: Negative (05/07 0000)   TDaP vaccine    10/18/1    Medications:  Current Outpatient Medications:    metroNIDAZOLE  (FLAGYL ) 500 MG tablet, Take 1 tablet (500 mg total) by mouth 2 (two) times daily., Disp: 14 tablet, Rfl: 0   etonogestrel -ethinyl estradiol  (NUVARING) 0.12-0.015 MG/24HR vaginal ring, Insert vaginally and leave in place for 3 consecutive weeks, then remove for 1 week., Disp: 1 each, Rfl: 12   paragard  intrauterine copper  IUD IUD, 1 each by Intrauterine route once., Disp: , Rfl:   Labs:  Lab Results  Component Value Date   HGBA1C 5.1 12/30/2021   No results found for: CHOL, HDL, LDLCALC, LDLDIRECT, TRIG, CHOLHDL  Notable Signs/Symptoms:  Wt Readings from Last 3 Encounters:  04/03/24 201 lb 4.8 oz (91.3 kg)  03/13/24 199 lb 8.3 oz (90.5 kg)  03/10/24 199 lb 11.2 oz (90.6 kg)    Lifestyle & Dietary Hx 8/4, 10/6 Pt presents today for follow up with her daughter. Pt reports she desires to reduce her body weight to a lower BMI range. Pt reports she does shopping and cooking. 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   Pt reports she is having smaller portions and selecting one starch item at meals times versus two or more. Pt reports she has decreased coffee and regular soda decreased intake. Pt reports she is improved her meals schedule having 2 meals with a snack options for lunch. Pt reports decreasing intake of sugary sweetened beverages. Pt reports she continue with soft ball team to increase physical activity participation. Pt reports she receives hello fresh delivered meals at times. Pt reports decreasing sweet tea intake to 3 times weekly. Pt reports sleep and stress improving with therapy. Pt reports she would like to increase physical activity using a treadmill and home weights. Pt reports eating out once times monthly. All Pt's questions questions were answered during this encounter.    *** Estimated daily fluid intake: 64 or more oz Supplements: none Sleep: on average 6-9 hours; fair Stress / self-care: 4 out of 10; time alone, EtOH, coloring, softball Current average weekly physical activity: softball 1 hour or more 2-4  days weekly; park with kids weekly, dancing while cleaning  24-Hr Dietary Recall First Meal: coffee with less sugar and flavored creamer, water, eggs, grits, avocado toast  Snack: skips  Second Meal: banana with trail mix or sandwich  Snack: none Third Meal: shake and bake chicken, asparagus, grilled corn or salmon patties, broccoli and cheese or pizza chili bean vegetables soup or grilled chicken sandwich with lettuce tomato, onion Snack: none Beverages: coffee with less sugar and flavored creamer, water, sweet tea   NUTRITION DIAGNOSIS  NB-1.1 Food and nutrition-related knowledge deficit As related to no prior nutrition related  education .  As evidenced by Pt reports and dietary recall.  NUTRITION INTERVENTION  Nutrition education (E-1) on the following topics:  Fruits & Vegetables: Aim to fill half your plate with a variety of fruits and vegetables. They are rich in vitamins, minerals, and fiber, and can help reduce the risk of chronic diseases. Choose a colorful assortment of fruits and vegetables to ensure you get a wide range of nutrients. Grains and Starches: Make at least half of your grain choices whole grains, such as brown rice, whole wheat bread, and oats. Whole grains provide fiber, which aids in digestion and healthy cholesterol levels. Aim for whole forms of starchy vegetables such as potatoes, sweet potatoes, beans, peas, and corn, which are fiber rich and provide many vitamins and minerals.  Protein: Incorporate lean sources of protein, such as poultry, fish, beans, nuts, and seeds, into your meals. Protein is essential for building and repairing tissues, staying full, balancing blood sugar, as well as supporting immune function. Dairy: Include low-fat or fat-free dairy products like milk, yogurt, and cheese in your diet. Dairy foods are excellent sources of calcium and vitamin D , which are crucial for bone health.  Physical Activity: Aim for 60 minutes of physical activity daily. Regular physical activity promotes overall health-including helping to reduce risk for heart disease and diabetes, promoting mental health, and helping us  sleep better.     Learning Style & Readiness for Change Teaching method utilized: Visual & Auditory  Demonstrated degree of understanding via: Teach Back  Barriers to learning/adherence to lifestyle change: none ***  Goals Established by Pt Great job with you meal schedule and portions and beverages choices Physical activity of 150 minutes weekly or more and 2 days of muscle strengthening 2 days weekly as tolerated    MONITORING & EVALUATION Dietary intake, weekly physical  activity  Next Steps  Patient is to return: ***

## 2024-05-12 ENCOUNTER — Ambulatory Visit: Payer: MEDICAID | Admitting: Dietician

## 2024-05-12 DIAGNOSIS — R638 Other symptoms and signs concerning food and fluid intake: Secondary | ICD-10-CM

## 2024-05-12 NOTE — Progress Notes (Signed)
 Medical Nutrition Therapy  Appointment Start time:  1530  Appointment End time:  1605  Primary concerns today: desire to reduce body weight to a lower BMI range  Referral diagnosis: Unable to lose weight  Preferred learning style: no preference indicated Learning readiness:  contemplating-ready   NUTRITION ASSESSMENT   Clinical Medical Hx:  Past Medical History:  Diagnosis Date   Adjustment disorder with mixed anxiety and depressed mood    Bipolar 1 disorder (HCC)    Former smoker    stopped smoking 08/2017; did smoke 3/4 PPD   GERD (gastroesophageal reflux disease)    Hernia of abdominal wall    History of substance use    cocaine and MJ   Migraine with aura    Suprvsn of high risk preg due to social problems, unsp tri 02/01/2018   Clinic [facility] Prenatal Labs  Dating  LMP =12 wk US  Blood type: --/--/O POS  (02/26 1504)   Genetic Screen 1 Screen:    AFP:     Quad:     NIPS: Antibody:Negative (05/07 0000)  Anatomic US  Complete, normal Rubella:    MMR x2 Varicella: Varivax x2  GTT Early:               Third trimester: 89 RPR: Non Reactive (09/20 1106)   Rhogam Not needed HBsAg: Negative (05/07 0000)   TDaP vaccine    10/18/1    Medications:  Current Outpatient Medications:    metroNIDAZOLE  (FLAGYL ) 500 MG tablet, Take 1 tablet (500 mg total) by mouth 2 (two) times daily., Disp: 14 tablet, Rfl: 0   etonogestrel -ethinyl estradiol  (NUVARING) 0.12-0.015 MG/24HR vaginal ring, Insert vaginally and leave in place for 3 consecutive weeks, then remove for 1 week., Disp: 1 each, Rfl: 12   paragard  intrauterine copper  IUD IUD, 1 each by Intrauterine route once. (Patient not taking: Reported on 05/19/2024), Disp: , Rfl:   Labs:  Lab Results  Component Value Date   HGBA1C 5.1 12/30/2021   No results found for: CHOL, HDL, LDLCALC, LDLDIRECT, TRIG, CHOLHDL  Notable Signs/Symptoms:  Wt Readings from Last 3 Encounters:  05/19/24 196 lb 9.6 oz (89.2 kg)  04/03/24 201 lb 4.8 oz  (91.3 kg)  03/13/24 199 lb 8.3 oz (90.5 kg)  Pt's body weight has decreased 5# or 2.5% in ~ 6 weeks and is desired and intentional   Lifestyle & Dietary Hx Pt presents today for follow up alone. Pt reports she desires to reduce her body weight to a lower BMI range. Pt reports she does shopping and cooking. 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 Pt reports she has cut out sugar and only adding SF creamer to her coffee. Pt reports she would like to increase physical activity using videos and home weights. Pt reports she is improved her meals schedule and having 3 meals most days. All Pt's questions questions were answered during this encounter.    Estimated daily fluid intake: 64 or more oz Supplements: none Sleep: on average 4-6 hours; poor  Stress / self-care: 4 out of 10; time alone, coloring Current average weekly physical activity: ADL's  24-Hr Dietary Recall First Meal: coffee with SF creamer egg whites, grits  Snack: none Second Meal: 1 slices of whole grain, turkey, ham, mayo, handful of chips Snack: none Third Meal: ground turkey, mushrooms, onions, squash, 1-2 tortilla, pepper jack Snack: none Beverages: coffee with SF creamer, water, energy drink once weekly  NUTRITION DIAGNOSIS  NB-1.1 Food and nutrition-related knowledge deficit As  related to no prior nutrition related education .  As evidenced by Pt reports and dietary recall.  NUTRITION INTERVENTION  Nutrition education (E-1) on the following topics:  Fruits & Vegetables: Aim to fill half your plate with a variety of fruits and vegetables. They are rich in vitamins, minerals, and fiber, and can help reduce the risk of chronic diseases. Choose a colorful assortment of fruits and vegetables to ensure you get a wide range of nutrients. Grains and Starches: Make at least half of your grain choices whole grains, such as brown rice, whole wheat bread, and oats. Whole grains provide fiber, which aids in digestion and healthy  cholesterol levels. Aim for whole forms of starchy vegetables such as potatoes, sweet potatoes, beans, peas, and corn, which are fiber rich and provide many vitamins and minerals.  Protein: Incorporate lean sources of protein, such as poultry, fish, beans, nuts, and seeds, into your meals. Protein is essential for building and repairing tissues, staying full, balancing blood sugar, as well as supporting immune function. Dairy: Include low-fat or fat-free dairy products like milk, yogurt, and cheese in your diet. Dairy foods are excellent sources of calcium and vitamin D , which are crucial for bone health.  Physical Activity: Aim for 60 minutes of physical activity daily. Regular physical activity promotes overall health-including helping to reduce risk for heart disease and diabetes, promoting mental health, and helping us  sleep better.    Learning Style & Readiness for Change Teaching method utilized: Visual & Auditory  Demonstrated degree of understanding via: Teach Back  Barriers to learning/adherence to lifestyle change: time management  Goals Established by Pt Great job with you meal schedule and portions and beverages choices Physical activity of 150 minutes weekly or more and 2 days of muscle strengthening 2 days weekly as tolerated    MONITORING & EVALUATION Dietary intake, weekly physical activity  Next Steps  Patient is to return 07/15/2024

## 2024-05-19 ENCOUNTER — Encounter: Payer: MEDICAID | Attending: Advanced Practice Midwife | Admitting: Dietician

## 2024-05-19 VITALS — Wt 196.6 lb

## 2024-05-19 DIAGNOSIS — R638 Other symptoms and signs concerning food and fluid intake: Secondary | ICD-10-CM | POA: Diagnosis present

## 2024-05-23 ENCOUNTER — Other Ambulatory Visit: Payer: Self-pay

## 2024-06-16 ENCOUNTER — Telehealth: Payer: MEDICAID | Admitting: Family Medicine

## 2024-06-16 DIAGNOSIS — B9689 Other specified bacterial agents as the cause of diseases classified elsewhere: Secondary | ICD-10-CM

## 2024-06-16 DIAGNOSIS — N760 Acute vaginitis: Secondary | ICD-10-CM | POA: Diagnosis not present

## 2024-06-16 MED ORDER — METRONIDAZOLE 500 MG PO TABS: 500.0000 mg | ORAL_TABLET | Freq: Two times a day (BID) | ORAL | 0 refills | Status: AC

## 2024-06-16 NOTE — Progress Notes (Signed)

## 2024-07-08 NOTE — Progress Notes (Unsigned)
 8/4, 10/6, 12/15  Medical Nutrition Therapy  Appointment Start time:  ***  Appointment End time:  ***  Primary concerns today: desire to reduce body weight to a lower BMI range  Referral diagnosis: Unable to lose weight  Preferred learning style: no preference indicated Learning readiness:  contemplating-ready   NUTRITION ASSESSMENT   Clinical Medical Hx:  Past Medical History:  Diagnosis Date   Adjustment disorder with mixed anxiety and depressed mood    Bipolar 1 disorder (HCC)    Former smoker    stopped smoking 08/2017; did smoke 3/4 PPD   GERD (gastroesophageal reflux disease)    Hernia of abdominal wall    History of substance use    cocaine and MJ   Migraine with aura    Suprvsn of high risk preg due to social problems, unsp tri 02/01/2018   [facility]inic [facility]atal [facility]  Dating  LMP =12 wk US  Blood type: --/--/O POS  (02/26 1504)   Genetic Screen 1 Screen:    AFP:     Quad:     NIPS: Antibody:Negative (05/07 0000)  Anatomic US  Complete, normal Rubella:    MMR x2 Varicella: Varivax x2  GTT Early:               Third trimester: 89 RPR: Non Reactive (09/20 1106)   Rhogam Not needed HBsAg: Negative (05/07 0000)   TDaP vaccine    10/18/1    Medications:  Current Outpatient Medications:    etonogestrel -ethinyl estradiol  (NUVARING) 0.12-0.015 MG/24HR vaginal ring, Insert vaginally and leave in place for 3 consecutive weeks, then remove for 1 week., Disp: 1 each, Rfl: 12  Labs:  Lab Results  Component Value Date   HGBA1C 5.1 12/30/2021   No results found for: CHOL, HDL, LDLCALC, LDLDIRECT, TRIG, CHOLHDL  Notable Signs/Symptoms:  Wt Readings from Last 3 Encounters:  05/19/24 196 lb 9.6 oz (89.2 kg)  04/03/24 201 lb 4.8 oz (91.3 kg)  03/13/24 199 lb 8.3 oz (90.5 kg)  Pt's body weight has decreased 5# or 2.5% in ~ 6 weeks and is desired and intentional   Lifestyle & Dietary Hx   Pt presents today for follow up alone. Pt reports she desires to reduce her body  weight to a lower BMI range. Pt reports she does shopping and cooking. 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 Pt reports she has cut out sugar and only adding SF creamer to her coffee. Pt reports she would like to increase physical activity using videos and home weights. Pt reports she is improved her meals schedule and having 3 meals most days. All Pt's questions questions were answered during this encounter.    Estimated daily fluid intake: 64 or more oz Supplements: none Sleep: on average 4-6 hours; poor  Stress / self-care: 4 out of 10; time alone, coloring Current average weekly physical activity: ADL's  24-Hr Dietary Recall First Meal: coffee with SF creamer egg whites, grits  Snack: none Second Meal: 1 slices of whole grain, turkey, ham, mayo, handful of chips Snack: none Third Meal: ground turkey, mushrooms, onions, squash, 1-2 tortilla, pepper jack Snack: none Beverages: coffee with SF creamer, water, energy drink once weekly  NUTRITION DIAGNOSIS  NB-1.1 Food and nutrition-related knowledge deficit As related to no prior nutrition related education .  As evidenced by Pt reports and dietary recall.  NUTRITION INTERVENTION  Nutrition education (E-1) on the following topics:  Fruits & Vegetables: Aim to fill half your plate with a variety  of fruits and vegetables. They are rich in vitamins, minerals, and fiber, and can help reduce the risk of chronic diseases. Choose a colorful assortment of fruits and vegetables to ensure you get a wide range of nutrients. Grains and Starches: Make at least half of your grain choices whole grains, such as brown rice, whole wheat bread, and oats. Whole grains provide fiber, which aids in digestion and healthy cholesterol levels. Aim for whole forms of starchy vegetables such as potatoes, sweet potatoes, beans, peas, and corn, which are fiber rich and provide many vitamins and minerals.  Protein: Incorporate lean sources of protein, such as poultry,  fish, beans, nuts, and seeds, into your meals. Protein is essential for building and repairing tissues, staying full, balancing blood sugar, as well as supporting immune function. Dairy: Include low-fat or fat-free dairy products like milk, yogurt, and cheese in your diet. Dairy foods are excellent sources of calcium and vitamin D , which are crucial for bone health.  Physical Activity: Aim for 60 minutes of physical activity daily. Regular physical activity promotes overall health-including helping to reduce risk for heart disease and diabetes, promoting mental health, and helping us  sleep better.    Learning Style & Readiness for Change Teaching method utilized: Visual & Auditory  Demonstrated degree of understanding via: Teach Back  Barriers to learning/adherence to lifestyle change: time management  Goals Established by Pt Great job with you meal schedule and portions and beverages choices Physical activity of 150 minutes weekly or more and 2 days of muscle strengthening 2 days weekly as tolerated    MONITORING & EVALUATION Dietary intake, weekly physical activity  Next Steps  Patient is to return 07/15/2024

## 2024-07-15 ENCOUNTER — Encounter: Payer: MEDICAID | Admitting: Dietician

## 2024-07-15 DIAGNOSIS — R638 Other symptoms and signs concerning food and fluid intake: Secondary | ICD-10-CM
